# Patient Record
Sex: Female | Born: 1960 | Hispanic: No | Smoking: Never smoker
Health system: Southern US, Community
[De-identification: ages and names within clinical notes are randomized; demographics above are authoritative.]

## PROBLEM LIST (undated history)

## (undated) DIAGNOSIS — R079 Chest pain, unspecified: Secondary | ICD-10-CM

## (undated) DIAGNOSIS — E079 Disorder of thyroid, unspecified: Secondary | ICD-10-CM

## (undated) DIAGNOSIS — I10 Essential (primary) hypertension: Secondary | ICD-10-CM

## (undated) HISTORY — DX: Chest pain, unspecified: R07.9

---

## 2009-05-12 ENCOUNTER — Other Ambulatory Visit: Admission: RE | Admit: 2009-05-12 | Discharge: 2009-05-12 | Payer: Self-pay | Admitting: Family Medicine

## 2011-01-28 ENCOUNTER — Ambulatory Visit: Payer: Self-pay

## 2011-02-18 ENCOUNTER — Ambulatory Visit (INDEPENDENT_AMBULATORY_CARE_PROVIDER_SITE_OTHER): Payer: Self-pay

## 2011-02-18 DIAGNOSIS — I781 Nevus, non-neoplastic: Secondary | ICD-10-CM

## 2012-04-06 ENCOUNTER — Other Ambulatory Visit (HOSPITAL_COMMUNITY)
Admission: RE | Admit: 2012-04-06 | Discharge: 2012-04-06 | Disposition: A | Discharge status: Home / Self Care | Payer: Self-pay | Source: Ambulatory Visit | Attending: Family Medicine | Admitting: Family Medicine

## 2012-04-06 DIAGNOSIS — Z0000 Encounter for general adult medical examination without abnormal findings: Secondary | ICD-10-CM | POA: Insufficient documentation

## 2013-04-23 ENCOUNTER — Encounter (HOSPITAL_COMMUNITY): Payer: Self-pay | Admitting: Emergency Medicine

## 2013-04-23 ENCOUNTER — Emergency Department (HOSPITAL_COMMUNITY): Payer: Self-pay

## 2013-04-23 ENCOUNTER — Emergency Department (HOSPITAL_COMMUNITY)
Admission: EM | Admit: 2013-04-23 | Discharge: 2013-04-23 | Disposition: A | Discharge status: Home / Self Care | Payer: Self-pay | Attending: Emergency Medicine | Admitting: Emergency Medicine

## 2013-04-23 DIAGNOSIS — I10 Essential (primary) hypertension: Secondary | ICD-10-CM | POA: Insufficient documentation

## 2013-04-23 DIAGNOSIS — R252 Cramp and spasm: Secondary | ICD-10-CM | POA: Insufficient documentation

## 2013-04-23 HISTORY — DX: Essential (primary) hypertension: I10

## 2013-04-23 MED ORDER — KETOROLAC TROMETHAMINE 60 MG/2ML IM SOLN
60.0000 mg | Freq: Once | INTRAMUSCULAR | Status: AC
  Administered 2013-04-23: 60 mg via INTRAMUSCULAR
  Filled 2013-04-23: qty 2

## 2013-04-23 MED ORDER — HYDROCODONE-ACETAMINOPHEN 5-325 MG PO TABS: 1.0000 | ORAL_TABLET | Freq: Four times a day (QID) | ORAL | Status: DC | PRN

## 2013-04-23 MED ORDER — IBUPROFEN 600 MG PO TABS: 600.0000 mg | ORAL_TABLET | Freq: Four times a day (QID) | ORAL | Status: DC | PRN

## 2013-04-23 MED ORDER — DIAZEPAM 5 MG PO TABS
5.0000 mg | ORAL_TABLET | Freq: Once | ORAL | Status: AC
  Administered 2013-04-23: 5 mg via ORAL
  Filled 2013-04-23: qty 1

## 2013-04-23 MED ORDER — METHOCARBAMOL 500 MG PO TABS: 500.0000 mg | ORAL_TABLET | Freq: Two times a day (BID) | ORAL | Status: DC

## 2013-04-23 NOTE — ED Provider Notes (Signed)
History     CSN: [id number]  Arrival date & time 04/23/13  1233   First MD Initiated Contact with Patient 04/23/13 1242      Chief Complaint  Patient presents with  . Neck Pain    (Consider location/radiation/quality/duration/timing/severity/associated sxs/prior treatment) HPI Comments: Patient presents emergency department with chief complaint of neck pain x5 months. She states that she was originally working out, and thinks that she strained her neck. She has had worsening pain over the past several days. This morning when she awoke, she states that it was severe pain to the left side of her neck. She denies fevers, chills, nausea, or vomiting. She has tried Tylenol, ibuprofen, and massage therapy. The pain does not radiate. She denies any bowel or bladder incontinence. She is able to ambulate.  She thinks that she may have aggravating her neck muscles by shoveling a load of mulch over the weekend.  The history is provided by the patient. No language interpreter was used.    Past Medical History  Diagnosis Date  . Hypertension     No past surgical history on file.  No family history on file.  History  Substance Use Topics  . Smoking status: Never Smoker   . Smokeless tobacco: Not on file  . Alcohol Use: Yes    OB History   Grav Para Term Preterm Abortions TAB SAB Ect Mult Living                  Review of Systems  All other systems reviewed and are negative.    Allergies  Review of patient's allergies indicates no known allergies.  Home Medications  No current outpatient prescriptions on file.  BP 144/95  Pulse 101  Temp(Src) 98.2 F (36.8 C) (Oral)  Resp 24  SpO2 98%  Physical Exam  Nursing note and vitals reviewed. Constitutional: She is oriented to person, place, and time. She appears well-developed and well-nourished.  Very dramatic at the bedside, tearful, screaming out in pain, but consolable when taking history  HENT:  Head: Normocephalic and  atraumatic.  Eyes: Conjunctivae and EOM are normal.  Neck: Normal range of motion.  Left-sided cervical paraspinal muscles tender to palpation, range of motion and strength deferred secondary to pain  Cardiovascular: Normal rate.   Pulmonary/Chest: Effort normal.  Abdominal: She exhibits no distension.  Musculoskeletal: Normal range of motion.  No C-spine tenderness, no step-offs or deformities, left-sided trapezius tender to palpation  Neurological: She is alert and oriented to person, place, and time.  Skin: Skin is dry.  Psychiatric: She has a normal mood and affect. Her behavior is normal. Judgment and thought content normal.    ED Course  Procedures (including critical care time)  Labs Reviewed - No data to display No results found.   1. Muscle cramps       MDM  Patient with back pain.  No neurological deficits and normal neuro exam.  Patient can walk but states is painful.  No loss of bowel or bladder control.  No concern for cauda equina.  No fever, night sweats, weight loss, h/o cancer, IVDU.  RICE protocol and pain medicine indicated and discussed with patient.   3:32 PM Patient is feeling much better.  No longer crying or wailing in pain.  She is comfortable.  I feel that she is able to be discharged to home with ortho/PCP follow up.  Return precautions given.           Roxy Horseman, PA-C  04/23/13 1543 

## 2013-04-23 NOTE — ED Notes (Signed)
C/O LEFT NECK PAIN X 5 MONTHS. WORSE TODAY. STATES IT STARTED WHEN EXERCISING. HAS BEEN GOING TO A MASSAGE THERAPIST. DENIES NUMBNESS OR TINGLING.

## 2013-04-23 NOTE — ED Notes (Signed)
Patient remains in radiology at this time.

## 2013-04-23 NOTE — ED Notes (Signed)
Patient family member advised that he is going to just take patient somewhere else.   Advised family member I understood.   He said "I can't just let her lay there hurting".   He was advised that we understand that patient is hurting, but that there were others in front of them.  Advised that PA would be in to see as soon as he could.  Family member advised that he understood.

## 2013-04-23 NOTE — ED Notes (Signed)
Dahlia Client, PA at bedside to evaluate.

## 2013-04-23 NOTE — ED Notes (Signed)
Left neck pain x 5 months has been seeing a massage thx but it is not helping has not seen a dr and she had to stop exersizing. Pt states that this am she woke up and it hurt worse

## 2013-04-23 NOTE — ED Notes (Signed)
Patient states "my neck still grabs me if I try to move it, but has relaxed when I am sitting still.

## 2013-04-24 NOTE — ED Provider Notes (Signed)
Medical screening examination/treatment/procedure(s) were performed by non-physician practitioner and as supervising physician I was immediately available for consultation/collaboration.  Raeford Razor, MD 04/24/13 1021

## 2015-09-15 ENCOUNTER — Other Ambulatory Visit: Payer: Self-pay | Admitting: *Deleted

## 2015-09-15 ENCOUNTER — Ambulatory Visit (INDEPENDENT_AMBULATORY_CARE_PROVIDER_SITE_OTHER): Payer: BLUE CROSS/BLUE SHIELD | Admitting: Family Medicine

## 2015-09-15 ENCOUNTER — Other Ambulatory Visit (INDEPENDENT_AMBULATORY_CARE_PROVIDER_SITE_OTHER): Payer: BLUE CROSS/BLUE SHIELD

## 2015-09-15 ENCOUNTER — Encounter: Payer: Self-pay | Admitting: Family Medicine

## 2015-09-15 VITALS — BP 122/84 | HR 68 | Ht 64.0 in | Wt 154.0 lb

## 2015-09-15 DIAGNOSIS — M999 Biomechanical lesion, unspecified: Secondary | ICD-10-CM

## 2015-09-15 DIAGNOSIS — M9908 Segmental and somatic dysfunction of rib cage: Secondary | ICD-10-CM | POA: Diagnosis not present

## 2015-09-15 DIAGNOSIS — M25511 Pain in right shoulder: Secondary | ICD-10-CM

## 2015-09-15 DIAGNOSIS — M9902 Segmental and somatic dysfunction of thoracic region: Secondary | ICD-10-CM

## 2015-09-15 DIAGNOSIS — M9901 Segmental and somatic dysfunction of cervical region: Secondary | ICD-10-CM | POA: Diagnosis not present

## 2015-09-15 DIAGNOSIS — M5030 Other cervical disc degeneration, unspecified cervical region: Secondary | ICD-10-CM | POA: Insufficient documentation

## 2015-09-15 HISTORY — DX: Other cervical disc degeneration, unspecified cervical region: M50.30

## 2015-09-15 HISTORY — DX: Biomechanical lesion, unspecified: M99.9

## 2015-09-15 MED ORDER — DICLOFENAC SODIUM 2 % TD SOLN
TRANSDERMAL | Status: AC
Start: 2015-09-15 — End: ?

## 2015-09-15 NOTE — Progress Notes (Signed)
Pre visit review using our clinic review tool, if applicable. No additional management support is needed unless otherwise documented below in the visit note. 

## 2015-09-15 NOTE — Assessment & Plan Note (Signed)
I do believe that some of the underlying arthritis is likely contribute in. We discussed icing regimen, home exercises, and we discussed proper ergonomics at work. Patient will line. We discussed icing regimen. Patient given oral anti-inflammatory's and then will switch to a topical anti-inflammatory. We discussed over-the-counter natural supplementations and may beneficial. Patient did respond fairly well to osteopathic manipulation. Patient will come back and see me again in 3-4 weeks for further evaluation and treatment.

## 2015-09-15 NOTE — Assessment & Plan Note (Signed)
Decision today to treat with OMT was based on Physical Exam  After verbal consent patient was treated with HVLA, ME, FPR techniques in cervical, thoracic and rib areas  Patient tolerated the procedure well with improvement in symptoms  Patient given exercises, stretches and lifestyle modifications  See medications in patient instructions if given  Patient will follow up in 3-4 weeks      

## 2015-09-15 NOTE — Patient Instructions (Addendum)
Good to see you.  Ice 20 minutes 2 times daily. Usually after activity and before bed. Exercises 3 times a week.  pennsaid pinkie amount topically 2 times daily as needed.  Vitamin D 2000 IU daily Turmeric  twice daily On wall with heels, butt shoulder and head touching for a goal of 5 minutes day Duexis 3 times a day for 3 days.  With work get monitor at eye level and duct tape tennis ball to chair between shoulder blade.  See me again in 3 weeks and if not better we will injection the shoulder

## 2015-09-15 NOTE — Telephone Encounter (Signed)
Refill done.  

## 2015-09-15 NOTE — Progress Notes (Signed)
[facility] [address] [place], [place] [address] Phone: [phone] Subjective:    I'm seeing this patient by the request  of:  No primary care provider on file.   CC: rightshoulder pain.  [id number] Ebony Valdez is a 54 y.o. female coming in with complaint of patient is having shoulder pain. Left-sided. Patient has had a for quite some time.patient has had many different medications over the course of time. Has had workup including x-rays of her neck back in 2014. These were reviewed by me and showed degenerative disc disease at C5-C6. Patient statesshe notices the pain more when sitting a long amount of time. Patient states that it seems to be more on the posterior shoulder blade. Some mild radiation down the arm. Denies weakness. Patient states that it can radiate up towards her neck as well. 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 Patient has tried muscle relaxer, anti-inflammatory, and heat. Patient states that it is more of a constant pain now. Dull throbbing in nature. Denies that it stops her from activities but makes things much more difficult. Not working out on a regular basis that she is to previously. Rates the severity of pain a 7 out of 10.  Previous x-rays in 2014 showed patient does have some mild degenerative disc disease at C5-C6. Sutures reviewed by me today.  Past Medical History  Diagnosis Date  . Hypertension    No past surgical history on file. Social History  Substance Use Topics  . Smoking status: Never Smoker   . Smokeless tobacco: Not on file  . Alcohol Use: Yes   No Known Allergies No family history on file.     Past medical history, social, surgical and family history all reviewed in electronic medical record.   Review of Systems: No headache, visual changes, nausea, vomiting, diarrhea, constipation, dizziness, abdominal pain, skin rash, fevers, chills, night sweats,  weight loss, swollen lymph nodes, body aches, joint swelling, muscle aches, chest pain, shortness of breath, mood changes.   Objective Blood pressure 122/84, pulse 68, height  (1.626 m), weight 154 lb (69.854 kg), SpO2 97 %.  General: No apparent distress alert and oriented x3 mood and affect normal, dressed appropriately.  HEENT: Pupils equal, extraocular movements intact  Respiratory: Patient's speak in full sentences and does not appear short of breath  Cardiovascular: No lower extremity edema, non tender, no erythema  Skin: Warm dry intact with no signs of infection or rash on extremities or on axial skeleton.  Abdomen: Soft nontender  Neuro: Cranial nerves II through XII are intact, neurovascularly intact in all extremities with 2+ DTRs and 2+ pulses.  Lymph: No lymphadenopathy of posterior or anterior cervical chain or axillae bilaterally.  Gait normal with good balance and coordination.  MSK:  Non tender with full range of motion and good stability and symmetric strength and tone of  elbows, wrist, hip, knee and ankles bilaterally.  Neck: Inspection unremarkable. No palpable stepoffs. Negative Spurling's maneuver. Full neck range of motion Grip strength and sensation normal in bilateral hands Strength good C4 to T1 distribution No sensory change to C4 to T1 Negative Hoffman sign bilaterally Reflexes normal Shoulder: right Inspection reveals no abnormalities, atrophy or asymmetry. Palpation is normal with no tenderness over AC joint or bicipital groove. ROM is full in all planes passively. Rotator cuff strength normal throughout. signs of impingement with positive Neer and Hawkin's tests, but negative empty can  sign. Speeds and Yergason's tests normal. No labral pathology noted with negative Obrien's, negative clunk and good stability. Normal scapular function observed. No painful arc and no drop arm sign. No apprehension sign   Osteopathic findings  C4 flexed  rotated and side bent right  C7 flexed rotated and side bent left T1 extended rotated and side bent right with elevated first rib T5 extended rotated and side bent right  Procedure note 97110; 15 minutes spent for Therapeutic exercises as stated in above notes.  This included exercises focusing on stretching, strengthening, with significant focus on eccentric aspects.Basic scapular stabilization to include adduction and depression of scapula Scaption, focusing on proper movement and good control Internal and External rotation utilizing a theraband, with elbow tucked at side entire time Rows with theraband   Proper technique shown and discussed handout in great detail with ATC.  All questions were discussed and answered.      Impression and Recommendations:     This case required medical decision making of moderate complexity.

## 2015-10-06 ENCOUNTER — Ambulatory Visit (INDEPENDENT_AMBULATORY_CARE_PROVIDER_SITE_OTHER): Payer: BLUE CROSS/BLUE SHIELD | Admitting: Family Medicine

## 2015-10-06 ENCOUNTER — Encounter: Payer: Self-pay | Admitting: Family Medicine

## 2015-10-06 VITALS — BP 130/84 | HR 68 | Ht 64.0 in | Wt 157.0 lb

## 2015-10-06 DIAGNOSIS — M999 Biomechanical lesion, unspecified: Secondary | ICD-10-CM

## 2015-10-06 DIAGNOSIS — M5030 Other cervical disc degeneration, unspecified cervical region: Secondary | ICD-10-CM

## 2015-10-06 DIAGNOSIS — M9901 Segmental and somatic dysfunction of cervical region: Secondary | ICD-10-CM

## 2015-10-06 NOTE — Assessment & Plan Note (Signed)
Decision today to treat with OMT was based on Physical Exam  After verbal consent patient was treated with HVLA, ME, FPR techniques in cervical, thoracic and rib areas  Patient tolerated the procedure well with improvement in symptoms  Patient given exercises, stretches and lifestyle modifications  See medications in patient instructions if given  Patient will follow up in 6-8 weeks                  

## 2015-10-06 NOTE — Progress Notes (Signed)
Pre visit review using our clinic review tool, if applicable. No additional management support is needed unless otherwise documented below in the visit note. 

## 2015-10-06 NOTE — Progress Notes (Signed)
  Ebony Valdez D.O. [facility] [address] [place], [place] [address] Phone: [phone] Subjective:      CC: right shoulder pain follow up  GEX:[id number]:Subjective Ebony Valdez is a 54 y.o. female coming in with complaint of patient is having shoulder pain. Left-sided. Patient has had a for quite some time.patient has had many different medications over the course of time. Has had workup including x-rays of her neck back in 2014. These were reviewed by me and showed degenerative disc disease at C5-C6. Patient was seen by me and did have more of a scapular dyskinesia as well as poor posture in her underlying degenerative disc disease seem to complain more of a role ventrally a shoulder injury. Patient did respond very well to osteopathic manipulation. Given home exercises and discussed over-the-counter natural supplementation. Patient statesshe's been doing significantly better. Up until today she was really having no pain. Patient has been working on the ergonomics at work. Denies any new symptoms. Patient has been very happy with the results with no side effects to any of the medications.  Previous x-rays in 2014 showed patient does have some mild degenerative disc disease at C5-C6. Sutures reviewed by me today.  Past Medical History  Diagnosis Date  . Hypertension    No past surgical history on file. Social History  Substance Use Topics  . Smoking status: Never Smoker   . Smokeless tobacco: None  . Alcohol Use: Yes   No Known Allergies No family history on file.     Past medical history, social, surgical and family history all reviewed in electronic medical record.   Review of Systems: No headache, visual changes, nausea, vomiting, diarrhea, constipation, dizziness, abdominal pain, skin rash, fevers, chills, night sweats, weight loss, swollen lymph nodes, body aches, joint swelling, muscle aches, chest pain, shortness of breath, mood changes.   Objective Blood pressure  130/84, pulse 68, height 5\' 4"  (1.626 m), weight 157 lb (71.215 kg), SpO2 98 %.  General: No apparent distress alert and oriented x3 mood and affect normal, dressed appropriately.  HEENT: Pupils equal, extraocular movements intact  Respiratory: Patient's speak in full sentences and does not appear short of breath  Cardiovascular: No lower extremity edema, non tender, no erythema  Skin: Warm dry intact with no signs of infection or rash on extremities or on axial skeleton.  Abdomen: Soft nontender  Neuro: Cranial nerves II through XII are intact, neurovascularly intact in all extremities with 2+ DTRs and 2+ pulses.  Lymph: No lymphadenopathy of posterior or anterior cervical chain or axillae bilaterally.  Gait normal with good balance and coordination.  MSK:  Non tender with full range of motion and good stability and symmetric strength and tone of  elbows, wrist, hip, knee and ankles bilaterally.  Neck: Inspection unremarkable. No palpable stepoffs. Negative Spurling's maneuver. Full neck range of motion Grip strength and sensation normal in bilateral hands Strength good C4 to T1 distribution No sensory change to C4 to T1 Negative Hoffman sign bilaterally Reflexes normal    Osteopathic findings  C4 flexed rotated and side bent right  C7 flexed rotated and side bent left T1 extended rotated and side bent right with elevated first rib T5 extended rotated and side bent right L2 flexed rotated and side bent right     Impression and Recommendations:     This case required medical decision making of moderate complexity.

## 2015-10-06 NOTE — Patient Instructions (Signed)
You are doing amazing No changes whatsoever Continue with the posture Excises still 3 times a week for 6 weeks.  When getting back in the gym keep hands within peripheral vision Also start at 50% of weight you were doing but then increase 25% a week.  See me again 6 weeks.

## 2015-10-06 NOTE — Assessment & Plan Note (Signed)
Patient does have degenerative disc disease but it is very mild overall. I do think that this is likely secondary to more poor posture. Continues to respond very well to osteopathic manipulation. Warned of any type of flare to take ibuprofen. Patient will continue all other medications and will follow-up with me again in 6 weeks for further evaluation and treatment.

## 2016-03-01 ENCOUNTER — Ambulatory Visit: Payer: BLUE CROSS/BLUE SHIELD | Admitting: Family Medicine

## 2017-09-10 ENCOUNTER — Ambulatory Visit (HOSPITAL_COMMUNITY)
Admission: EM | Admit: 2017-09-10 | Discharge: 2017-09-10 | Disposition: A | Discharge status: Home / Self Care | Payer: BLUE CROSS/BLUE SHIELD | Attending: Family Medicine | Admitting: Family Medicine

## 2017-09-10 ENCOUNTER — Encounter (HOSPITAL_COMMUNITY): Payer: Self-pay | Admitting: *Deleted

## 2017-09-10 ENCOUNTER — Ambulatory Visit (INDEPENDENT_AMBULATORY_CARE_PROVIDER_SITE_OTHER): Payer: Self-pay

## 2017-09-10 DIAGNOSIS — M25532 Pain in left wrist: Secondary | ICD-10-CM

## 2017-09-10 HISTORY — DX: Disorder of thyroid, unspecified: E07.9

## 2017-09-10 MED ORDER — PREDNISONE 10 MG (21) PO TBPK
ORAL_TABLET | Freq: Every day | ORAL | 0 refills | Status: DC
Start: 2017-09-10 — End: 2020-12-28

## 2017-09-10 NOTE — ED Triage Notes (Signed)
C/O left wrist pain x "a couple months" without known injury.  Has tried wrist brace.  Yesterday while mowing lawn, felt like left wrist popped - states "had to pop it back into place".  Continues with significant pain.

## 2017-09-10 NOTE — ED Provider Notes (Signed)
  [facility]   [id number] 09/10/17 Arrival Time: 1248  ASSESSMENT & PLAN:  1. Left wrist pain     Meds ordered this encounter  Medications  . predniSONE (STERAPRED UNI-PAK 21 TAB) 10 MG (21) TBPK tablet    Sig: Take by mouth daily. Take as directed.    Dispense:  21 tablet    Refill:  0   Thumb spica splint applied. Will f/u with PCP if not improving within one week.  Reviewed expectations re: course of current medical issues. Questions answered. Outlined signs and symptoms indicating need for more acute intervention. Patient verbalized understanding. After Visit Summary given.   SUBJECTIVE:  Ebony Valdez is a 56 y.o. female who reports pain of her left wrist. Gradual onset approx 2 months ago. No injury or trauma. Does reports that "it pops out and I have to turn my wrist to get it back into place." This last happened yesterday while mowing lawn. OTC wrist splint without relief. No specific aggravating or alleviating factors reported. Occasional ibuprofen with mild help. No distal numbness/tingling. 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 Questions relation. R-handed.  ROS: As per HPI.   OBJECTIVE:  Vitals:   09/10/17 1331  BP: 130/81  Pulse: 75  Resp: 16  Temp: 97.9 F (36.6 C)  TempSrc: Oral  SpO2: 99%    General appearance: alert; no distress Extremities: no cyanosis or edema; symmetrical with no gross deformities; tenderness over her left lateral wrist with no swelling and no bruising; FROM; normal grip CV: normal extremity capillary refill Skin: warm and dry Neurologic: normal gait; normal symmetric reflexes in all extremities; normal sensation  Psychological: alert and cooperative; normal mood and affect  Imaging: Dg Wrist Complete Left  Result Date: 09/10/2017 CLINICAL DATA:  Left wrist pain for 2 months.  Worsening pain. EXAM: LEFT WRIST - COMPLETE 3+ VIEW COMPARISON:  None. FINDINGS: Left wrist is located without a fracture or dislocation.  Alignment is within normal limits. Soft tissues are unremarkable. Carpal bones are intact. IMPRESSION: Negative radiographs of the left wrist. Electronically Signed   By: Richarda Overlie M.D.   On: 09/10/2017 14:00    No Known Allergies  Past Medical History:  Diagnosis Date  . Hypertension    situational  . Thyroid disease    Social History   Social History  . Marital status: Unknown    Spouse name: N/A  . Number of children: N/A  . Years of education: N/A   Occupational History  . Not on file.   Social History Main Topics  . Smoking status: Never Smoker  . Smokeless tobacco: Never Used  . Alcohol use Yes     Comment: occasionally  . Drug use: No  . Sexual activity: Not on file   Other Topics Concern  . Not on file   Social History Narrative  . No narrative on file      Mardella Layman, MD 09/10/17 1420

## 2020-02-11 ENCOUNTER — Encounter (HOSPITAL_COMMUNITY): Payer: Self-pay | Admitting: Emergency Medicine

## 2020-02-11 ENCOUNTER — Emergency Department (HOSPITAL_COMMUNITY)
Admission: EM | Admit: 2020-02-11 | Discharge: 2020-02-12 | Disposition: A | Discharge status: Home / Self Care | Payer: 59 | Attending: Emergency Medicine | Admitting: Emergency Medicine

## 2020-02-11 ENCOUNTER — Other Ambulatory Visit: Payer: Self-pay

## 2020-02-11 ENCOUNTER — Emergency Department (HOSPITAL_COMMUNITY): Payer: 59

## 2020-02-11 DIAGNOSIS — R0789 Other chest pain: Secondary | ICD-10-CM

## 2020-02-11 DIAGNOSIS — R9431 Abnormal electrocardiogram [ECG] [EKG]: Secondary | ICD-10-CM | POA: Diagnosis not present

## 2020-02-11 LAB — BASIC METABOLIC PANEL
Anion gap: 11 (ref 5–15)
BUN: 9 mg/dL (ref 6–20)
CO2: 23 mmol/L (ref 22–32)
Calcium: 9.1 mg/dL (ref 8.9–10.3)
Chloride: 102 mmol/L (ref 98–111)
Creatinine, Ser: 0.99 mg/dL (ref 0.44–1.00)
GFR calc Af Amer: >60 mL/min (ref >60)
GFR calc non Af Amer: >60 mL/min (ref >60)
Glucose, Bld: 126 mg/dL — ABNORMAL HIGH (ref 70–99)
Potassium: 3.2 mmol/L — ABNORMAL LOW (ref 3.5–5.1)
Sodium: 136 mmol/L (ref 135–145)

## 2020-02-11 LAB — CBC
HCT: 34.2 % — ABNORMAL LOW (ref 36.0–46.0)
Hemoglobin: 11.4 g/dL — ABNORMAL LOW (ref 12.0–15.0)
MCH: 30 pg (ref 26.0–34.0)
MCHC: 33.3 g/dL (ref 30.0–36.0)
MCV: 90 fL (ref 80.0–100.0)
Platelets: 182 10*3/uL (ref 150–400)
RBC: 3.8 MIL/uL — ABNORMAL LOW (ref 3.87–5.11)
RDW: 12.3 % (ref 11.5–15.5)
WBC: 4.3 10*3/uL (ref 4.0–10.5)
nRBC: 0 % (ref 0.0–0.2)

## 2020-02-11 LAB — I-STAT BETA HCG BLOOD, ED (MC, WL, AP ONLY): I-stat hCG, quantitative: <5 m[IU]/mL (ref <5)

## 2020-02-11 LAB — TROPONIN I (HIGH SENSITIVITY)
Troponin I (High Sensitivity): <2 ng/L (ref <18)
Troponin I (High Sensitivity): 4 ng/L (ref <18)

## 2020-02-11 MED ORDER — SODIUM CHLORIDE 0.9% FLUSH: 3.0000 mL | Freq: Once | INTRAVENOUS | Status: DC

## 2020-02-11 NOTE — ED Notes (Signed)
[phone] Pts daughter Christoper Allegra, call and update when avail. Aware pt is in waiting

## 2020-02-11 NOTE — ED Triage Notes (Signed)
Per EMS, pt from home, began having substernal sharp/stabbing 10/10 chest pain that radiates to her left arm.  Took 324 ASA prior to EMS, given one nitro by EMS, no change in pain (BP from 170/90 to 139/92).    18 G L arm 90 HR 98% RA 98.1 temp

## 2020-02-12 NOTE — Discharge Instructions (Signed)
I recommend close follow-up with your primary care physician and cardiology as an outpatient.  Your cardiac labs, chest x-ray today were normal.  Your EKG did show inferior lateral T wave inversions but no old for comparison.  Your case was discussed with the cardiologist on-call who felt it was reasonable to like to be discharged home with close outpatient follow-up given you are asymptomatic and have had 2 negative high-sensitivity troponins.  If you have any return of chest pain, shortness of breath, sudden sweating, feel like you are going to pass out or do pass out, please return to the emergency department immediately.

## 2020-02-12 NOTE — ED Notes (Signed)
Pt to ED RM 22 from [facility]. Pt is A&Ox4, in NAD. Breathing easy, non-labored. equal rise and fall of chest noted. Pt reports she called EMS after having substernal sharp/stabbing 10/10 chest pain that radiates to her left arm.  Took 324 ASA prior to EMS, given one nitro by EMS. Pt denies CP presently. Denies SOB. Denies N/V/D

## 2020-02-12 NOTE — ED Provider Notes (Signed)
TIME SEEN: 12:47 AM  CHIEF COMPLAINT: Chest pain  HPI: Patient is a 59 year old female with history of hypothyroidism who presents to the emergency department with sharp central chest pain that started yesterday.  States it took her breath away.  She would have shortness of breath with exertion but no exertional chest pain.  States she thought initially this was acid reflux because it felt similar.  Pain now completely resolved.  Did have some mild dizziness.  No nausea, vomiting, diaphoresis.  No history of diabetes, hyperlipidemia.  She does have situational hypertension but is not on medications.  No history of tobacco use.  No family history of CAD.  No calf tenderness or calf swelling.  ROS: See HPI Constitutional: no fever  Eyes: no drainage  ENT: no runny nose   Cardiovascular:   chest pain  Resp:  SOB  GI: no vomiting GU: no dysuria Integumentary: no rash  Allergy: no hives  Musculoskeletal: no leg swelling  Neurological: no slurred speech ROS otherwise negative  PAST MEDICAL HISTORY/PAST SURGICAL HISTORY:  Past Medical History:  Diagnosis Date  . Hypertension    situational  . Thyroid disease     MEDICATIONS:  Prior to Admission medications   Medication Sig Start Date End Date Taking? Authorizing Provider  Diclofenac Sodium 2 % SOLN Apply 1 pump twice daily. 09/15/15   Judi Saa, DO  diphenhydrAMINE (BENADRYL) 25 MG tablet Take 75 mg by mouth at bedtime as needed for itching.    [provider]  Echinacea 500 MG CAPS Take 1 capsule by mouth daily.    [provider]  HYDROcodone-acetaminophen (NORCO/VICODIN) 5-325 MG per tablet Take 1 tablet by mouth every 6 (six) hours as needed for pain. 04/23/13   Roxy Horseman, PA-C  ibuprofen (ADVIL,MOTRIN) 600 MG tablet Take 1 tablet (600 mg total) by mouth every 6 (six) hours as needed for pain. 04/23/13   Roxy Horseman, PA-C  Levothyroxine Sodium (SYNTHROID PO) Take by mouth.    [provider]  methocarbamol (ROBAXIN) 500 MG tablet Take 1 tablet (500 mg total) by mouth 2 (two) times daily. 04/23/13   Roxy Horseman, PA-C  Multiple Vitamin (MULTIVITAMIN WITH MINERALS) TABS Take 1 tablet by mouth daily.    [provider]  predniSONE (STERAPRED UNI-PAK 21 TAB) 10 MG (21) TBPK tablet Take by mouth daily. Take as directed. 09/10/17   Mardella Layman, MD  pseudoephedrine-guaifenesin (MUCINEX D) 60-600 MG per tablet Take 1 tablet by mouth daily as needed for congestion (for allergies).    [provider]  vitamin B-12 (CYANOCOBALAMIN) 1000 MCG tablet Take 1,000 mcg by mouth daily.    [provider]  vitamin C (ASCORBIC ACID) 500 MG tablet Take 500 mg by mouth daily.    [provider]  vitamin E 400 UNIT capsule Take 400 Units by mouth daily.    [provider]    ALLERGIES:  No Known Allergies  SOCIAL HISTORY:  Social History   Tobacco Use  . Smoking status: Never Smoker  . Smokeless tobacco: Never Used  Substance Use Topics  . Alcohol use: Yes    Comment: occasionally    FAMILY HISTORY: No family history on file.  EXAM: BP 134/81 (BP Location: Right Arm)   Pulse 64   Temp 98.3 F (36.8 C) (Oral)   Resp 14   SpO2 100%  CONSTITUTIONAL: Alert and oriented and responds appropriately to questions. Well-appearing; well-nourished HEAD: Normocephalic EYES: Conjunctivae clear, pupils appear equal, EOM appear  intact ENT: normal nose; moist mucous membranes NECK: Supple, normal ROM CARD: RRR; S1 and S2 appreciated; no murmurs, no clicks, no rubs, no gallops RESP: Normal chest excursion without splinting or tachypnea; breath sounds clear and equal bilaterally; no wheezes, no rhonchi, no rales, no hypoxia or respiratory distress, speaking full sentences ABD/GI: Normal bowel sounds; non-distended; soft, non-tender, no rebound, no guarding, no peritoneal signs, no hepatosplenomegaly BACK:  The back appears normal EXT: Normal ROM in all  joints; no deformity noted, no edema; no cyanosis, no calf tenderness or calf swelling SKIN: Normal color for age and race; warm; no rash on exposed skin NEURO: Moves all extremities equally PSYCH: The patient's mood and manner are appropriate.   MEDICAL DECISION MAKING: Patient here with atypical chest pain.  No risk factors for ACS other than age.  EKG shows inferolateral T wave inversions with no old for comparison.  She has had 2 negative troponins here.  Completely chest pain-free at this time.  Doubt ACS, PE, dissection.  Patient's heart score is 2 based on age and abnormal EKG.  I feel safe discharging patient home with close outpatient cardiology follow-up.  She is also comfortable with this plan.  Discussed strict return precautions.  Patient verbalized understanding.   At this time, I do not feel there is any life-threatening condition present. I have reviewed, interpreted and discussed all results (EKG, imaging, lab, urine as appropriate) and exam findings with patient/family. I have reviewed nursing notes and appropriate previous records.  I feel the patient is safe to be discharged home without further emergent workup and can continue workup as an outpatient as needed. Discussed usual and customary return precautions. Patient/family verbalize understanding and are comfortable with this plan.  Outpatient follow-up has been provided as needed. All questions have been answered.     EKG Interpretation  Date/Time:  Monday February 11 2020 19:22:49 EST Ventricular Rate:  88 PR Interval:  154 QRS Duration: 64 QT Interval:  338 QTC Calculation: 408 R Axis:   23 Text Interpretation: Normal sinus rhythm ST & T wave abnormality, consider inferolateral ischemia Abnormal ECG No old tracing to compare Confirmed by Merrily Pew [phone]) on 02/11/2020 10:18:53 PM         EKG Interpretation  Date/Time:  Monday February 11 2020 20:03:06 EST Ventricular Rate:  77 PR Interval:  156 QRS  Duration: 66 QT Interval:  348 QTC Calculation: 393 R Axis:   22 Text Interpretation: Normal sinus rhythm Septal infarct , age undetermined ST & T wave abnormality, consider inferolateral ischemia Abnormal ECG No significant change since last tracing Confirmed by Pryor Curia [phone]) on 02/12/2020 12:48:16 AM         EKG Interpretation  Date/Time:  Tuesday February 12 2020 01:38:30 EST Ventricular Rate:  57 PR Interval:  156 QRS Duration: 78 QT Interval:  410 QTC Calculation: 400 R Axis:   30 Text Interpretation: Sinus rhythm Borderline repolarization abnormality No significant change since last tracing Confirmed by Pryor Curia [phone]) on 02/12/2020 1:43:06 AM           Junius Finner was evaluated in Emergency Department on 02/12/2020 for the symptoms described in the history of present illness. She was evaluated in the context of the global COVID-19 pandemic, which necessitated consideration that the patient might be at risk for infection with the SARS-CoV-2 virus that causes COVID-19. Institutional protocols and algorithms that pertain to the evaluation of patients at risk for COVID-19 are in a state of rapid change based  on information released by regulatory bodies including the CDC and federal and state organizations. These policies and algorithms were followed during the patient's care in the ED.  Patient was seen wearing N95, face shield, gloves.    Sarita Hakanson, Layla Maw, DO 02/12/20 [phone]

## 2020-02-12 NOTE — ED Notes (Addendum)
Patient verbalizes understanding of discharge instructions and follow up care. Opportunity for questioning and answers were provided. All questions answered completely. PIV removed, catheter intact. Site dressed with gauze and tape. Armband removed by staff, pt discharged from ED. Ambulatory from ED with strong, steady gait 

## 2020-02-14 ENCOUNTER — Ambulatory Visit: Payer: 59 | Admitting: Cardiology

## 2020-02-27 ENCOUNTER — Ambulatory Visit: Payer: 59 | Admitting: Cardiovascular Disease

## 2020-03-18 ENCOUNTER — Ambulatory Visit: Payer: 59 | Admitting: Cardiology

## 2020-04-03 ENCOUNTER — Other Ambulatory Visit (HOSPITAL_COMMUNITY)
Admission: RE | Admit: 2020-04-03 | Discharge: 2020-04-03 | Disposition: A | Discharge status: Home / Self Care | Payer: 59 | Source: Ambulatory Visit | Attending: Family Medicine | Admitting: Family Medicine

## 2020-04-03 DIAGNOSIS — Z124 Encounter for screening for malignant neoplasm of cervix: Secondary | ICD-10-CM | POA: Diagnosis present

## 2020-04-07 ENCOUNTER — Ambulatory Visit: Payer: 59 | Admitting: Cardiology

## 2020-04-10 LAB — CYTOLOGY - PAP
Comment: NEGATIVE
Diagnosis: NEGATIVE
High risk HPV: NEGATIVE

## 2020-05-05 IMAGING — CR DG CHEST 2V
2 series · 2 of 2 positions shown · non-contrast
Comparison: None.

CLINICAL DATA: Nausea.  Chest pain.  Headache for 1 day.

EXAM:
CHEST - 2 VIEW

[chest pa]
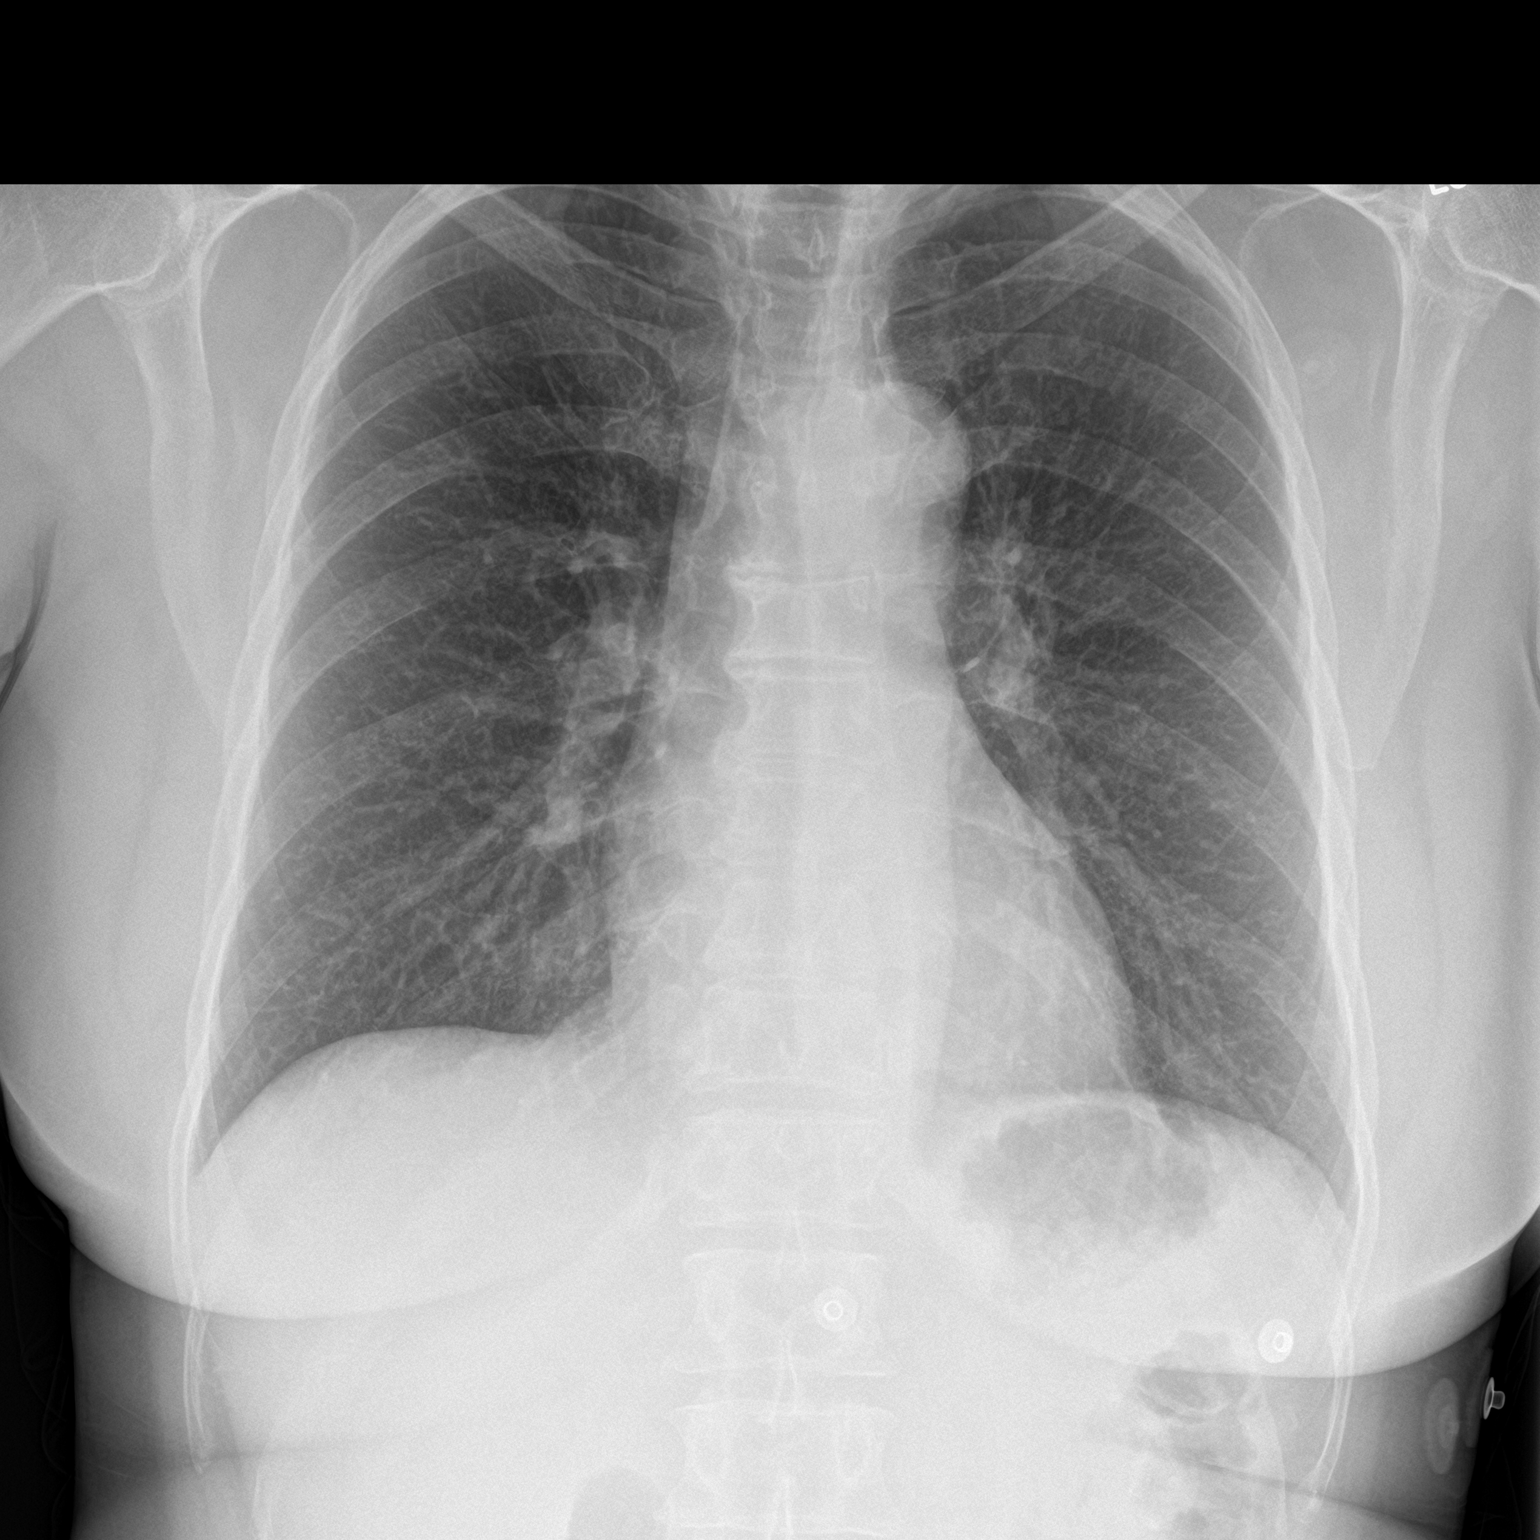

[chest lat]
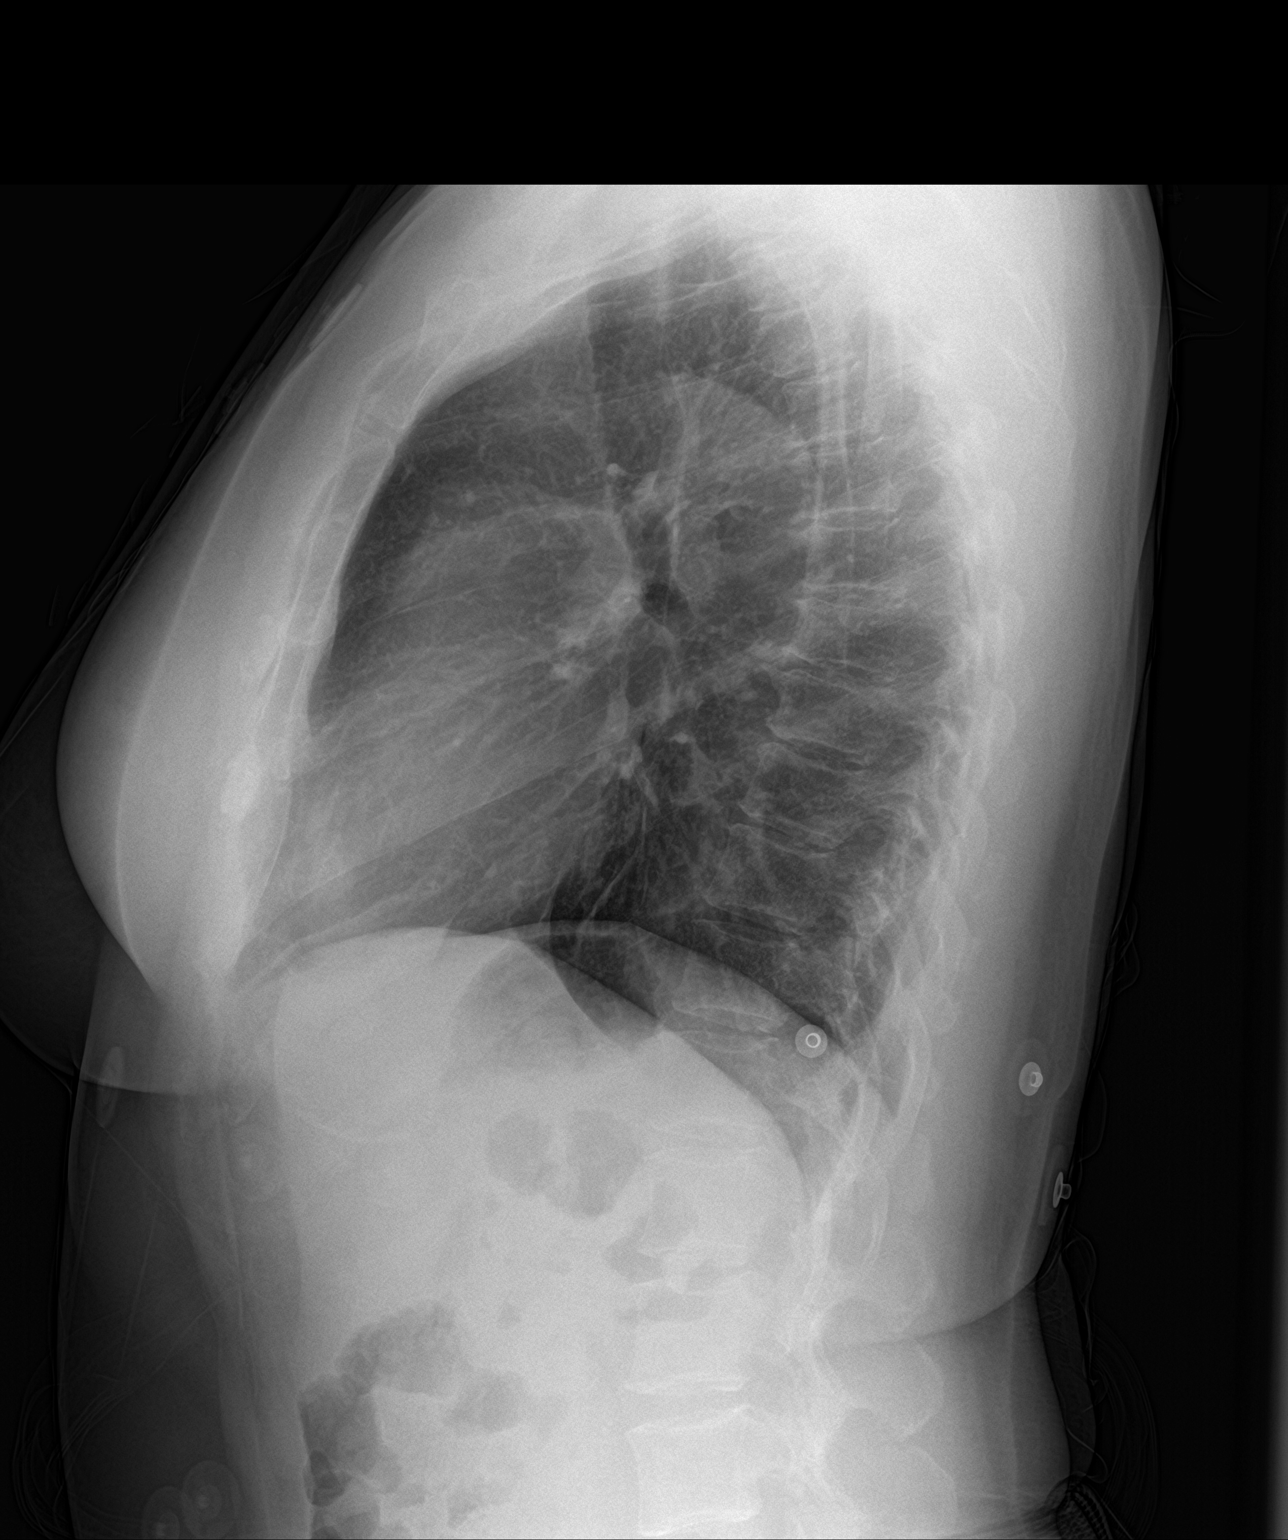

[2 of 2 positions shown; findings below may reference images not displayed]

FINDINGS: Midline trachea.  Normal heart size and mediastinal contours.

Sharp costophrenic angles.  No pneumothorax.  Clear lungs.
IMPRESSION: -:
IMPRESSION: -
Normal chest.

## 2020-12-28 ENCOUNTER — Ambulatory Visit (HOSPITAL_COMMUNITY)
Admission: EM | Admit: 2020-12-28 | Discharge: 2020-12-28 | Disposition: A | Discharge status: Home / Self Care | Payer: 59 | Attending: Emergency Medicine | Admitting: Emergency Medicine

## 2020-12-28 ENCOUNTER — Other Ambulatory Visit: Payer: Self-pay

## 2020-12-28 ENCOUNTER — Ambulatory Visit (INDEPENDENT_AMBULATORY_CARE_PROVIDER_SITE_OTHER): Payer: 59

## 2020-12-28 ENCOUNTER — Encounter (HOSPITAL_COMMUNITY): Payer: Self-pay

## 2020-12-28 DIAGNOSIS — H9202 Otalgia, left ear: Secondary | ICD-10-CM

## 2020-12-28 DIAGNOSIS — R059 Cough, unspecified: Secondary | ICD-10-CM | POA: Diagnosis not present

## 2020-12-28 DIAGNOSIS — J029 Acute pharyngitis, unspecified: Secondary | ICD-10-CM

## 2020-12-28 MED ORDER — FLUTICASONE PROPIONATE 50 MCG/ACT NA SUSP
1.0000 | Freq: Every day | NASAL | 0 refills | Status: AC
Start: 2020-12-28 — End: ?

## 2020-12-28 MED ORDER — BENZONATATE 100 MG PO CAPS: 100.0000 mg | ORAL_CAPSULE | Freq: Three times a day (TID) | ORAL | 0 refills | Status: DC | PRN

## 2020-12-28 MED ORDER — PREDNISONE 20 MG PO TABS: 40.0000 mg | ORAL_TABLET | Freq: Every day | ORAL | 0 refills | Status: AC

## 2020-12-28 NOTE — Discharge Instructions (Signed)
Your chest xray and physical exam are normal today.  I do not see any indication for further antibiotics.  I would like to try some prednisone, steroid, to see if this helps with your cough and ear/throat sensation.  Continue with over the counter medication such as mucinex, as needed.  Tessalon as needed for cough.  I would also like you to start daily flonase.  Please follow up with your primary care provider for recheck in the next two weeks.  Return for any worsening of symptoms.

## 2020-12-28 NOTE — ED Triage Notes (Signed)
Pt presents with productive cough and sore throat for over 2 weeks.

## 2020-12-28 NOTE — ED Provider Notes (Signed)
[facility]    CSN: [id number] Arrival date & time: 12/28/20  1142      History   Chief Complaint Chief Complaint  Patient presents with  . Cough  . Sore Throat    HPI Ebony Valdez is a 60 y.o. female.   Productive cough, no shortness of breath , left ear/ throat pain. Symptoms started around 12/25. A week after this her PCP prescribed her an antibiotic which she took for 5 days, she felt like maybe she mildly improved but symptoms didn't resolve. Now with more left ear/throat pain, as well as productive cough. No shortness of breath . No fevers. No dizziness. Cough is sometimes productive. No gi symptoms. No asthma history.    ROS per HPI, negative if not otherwise mentioned.      Past Medical History:  Diagnosis Date  . Chest pain   . Degenerative disc disease, cervical 09/15/2015   Mild C5-C6   . Hypertension    situational  . Nonallopathic lesion of cervical region 09/15/2015  . Nonallopathic lesion of thoracic region 09/15/2015  . Nonallopathic lesion-rib cage 09/15/2015  . Thyroid disease     Patient Active Problem List   Diagnosis Date Noted  . Degenerative disc disease, cervical 09/15/2015  . Nonallopathic lesion of cervical region 09/15/2015  . Nonallopathic lesion-rib cage 09/15/2015  . Nonallopathic lesion of thoracic region 09/15/2015    History reviewed. No pertinent surgical history.  OB History   No obstetric history on file.      Home Medications    Prior to Admission medications   Medication Sig Start Date End Date Taking? Authorizing Provider  benzonatate (TESSALON) 100 MG capsule Take 1-2 capsules (100-200 mg total) by mouth 3 (three) times daily as needed for cough. 12/28/20  Yes Burky, Dorene Grebe B, NP  fluticasone (FLONASE) 50 MCG/ACT nasal spray Place 1 spray into both nostrils daily. 12/28/20  Yes Burky, Dorene Grebe B, NP  predniSONE (DELTASONE) 20 MG tablet Take 2 tablets (40 mg total) by mouth daily with breakfast for 5  days. 12/28/20 01/02/21 Yes Burky, Dorene Grebe B, NP  Diclofenac Sodium 2 % SOLN Apply 1 pump twice daily. 09/15/15   Judi Saa, DO  diphenhydrAMINE (BENADRYL) 25 MG tablet Take 75 mg by mouth at bedtime as needed for itching.    [provider]  Echinacea 500 MG CAPS Take 1 capsule by mouth daily.    [provider]  HYDROcodone-acetaminophen (NORCO/VICODIN) 5-325 MG per tablet Take 1 tablet by mouth every 6 (six) hours as needed for pain. 04/23/13   Roxy Horseman, PA-C  ibuprofen (ADVIL,MOTRIN) 600 MG tablet Take 1 tablet (600 mg total) by mouth every 6 (six) hours as needed for pain. 04/23/13   Roxy Horseman, PA-C  Levothyroxine Sodium (SYNTHROID PO) Take by mouth.    [provider]  methocarbamol (ROBAXIN) 500 MG tablet Take 1 tablet (500 mg total) by mouth 2 (two) times daily. 04/23/13   Roxy Horseman, PA-C  Multiple Vitamin (MULTIVITAMIN WITH MINERALS) TABS Take 1 tablet by mouth daily.    [provider]  pseudoephedrine-guaifenesin (MUCINEX D) 60-600 MG per tablet Take 1 tablet by mouth daily as needed for congestion (for allergies).    [provider]  vitamin B-12 (CYANOCOBALAMIN) 1000 MCG tablet Take 1,000 mcg by mouth daily.    [provider]  vitamin C (ASCORBIC ACID) 500 MG tablet Take 500 mg by mouth daily.    [provider]  vitamin E 400 UNIT capsule Take  400 Units by mouth daily.    [provider]    Family History History reviewed. No pertinent family history.  Social History Social History   Tobacco Use  . Smoking status: Never Smoker  . Smokeless tobacco: Never Used  Substance Use Topics  . Alcohol use: Yes    Comment: occasionally  . Drug use: No     Allergies   Patient has no known allergies.   Review of Systems Review of Systems   Physical Exam Triage Vital Signs ED Triage Vitals  Enc Vitals Group     BP 12/28/20 1229 126/88     Pulse Rate 12/28/20 1229 72     Resp  12/28/20 1229 17     Temp 12/28/20 1229 98.5 F (36.9 C)     Temp Source 12/28/20 1229 Oral     SpO2 12/28/20 1229 100 %     Weight --      Height --      Head Circumference --      Peak Flow --      Pain Score 12/28/20 1227 6     Pain Loc --      Pain Edu? --      Excl. in GC? --    No data found.  Updated Vital Signs BP 126/88 (BP Location: Right Arm)   Pulse 72   Temp 98.5 F (36.9 C) (Oral)   Resp 17   SpO2 100%   Visual Acuity Right Eye Distance:   Left Eye Distance:   Bilateral Distance:    Right Eye Near:   Left Eye Near:    Bilateral Near:     Physical Exam Constitutional:      General: She is not in acute distress.    Appearance: She is well-developed.  HENT:     Right Ear: Tympanic membrane and ear canal normal.     Left Ear: Tympanic membrane normal.     Mouth/Throat:     Mouth: Mucous membranes are moist. No oral lesions.     Pharynx: No oropharyngeal exudate or uvula swelling.     Tonsils: No tonsillar exudate.  Cardiovascular:     Rate and Rhythm: Normal rate.  Pulmonary:     Effort: Pulmonary effort is normal. No respiratory distress.     Breath sounds: Normal breath sounds.  Skin:    General: Skin is warm and dry.  Neurological:     Mental Status: She is alert and oriented to person, place, and time.      UC Treatments / Results  Labs (all labs ordered are listed, but only abnormal results are displayed) Labs Reviewed - No data to display  EKG   Radiology DG Chest 2 View  Result Date: 12/28/2020 CLINICAL DATA:  Patient with cough for multiple weeks.  Sore throat. EXAM: CHEST - 2 VIEW COMPARISON:  Chest radiograph 02/11/2020 FINDINGS: The heart size and mediastinal contours are within normal limits. Both lungs are clear. The visualized skeletal structures are unremarkable. IMPRESSION: No active cardiopulmonary disease. Electronically Signed   By: Annia Belt M.D.   On: 12/28/2020 13:03    Procedures Procedures (including critical  care time)  Medications Ordered in UC Medications - No data to display  Initial Impression / Assessment and Plan / UC Course  I have reviewed the triage vital signs and the nursing notes.  Pertinent labs & imaging results that were available during my care of the patient were reviewed by me and considered in my  medical decision making (see chart for details).     Non toxic. Benign physical exam. Chest xray without acute findings. Encouraged to continue taking her her nexium for reflux. A few days of prednisone to try to help her throat/ ear symptoms as well as chest symptoms. Follow up with PCP prn. Return precautions provided. Patient verbalized understanding and agreeable to plan.   Final Clinical Impressions(s) / UC Diagnoses   Final diagnoses:  Cough  Otalgia of left ear     Discharge Instructions     Your chest xray and physical exam are normal today.  I do not see any indication for further antibiotics.  I would like to try some prednisone, steroid, to see if this helps with your cough and ear/throat sensation.  Continue with over the counter medication such as mucinex, as needed.  Tessalon as needed for cough.  I would also like you to start daily flonase.  Please follow up with your primary care provider for recheck in the next two weeks.  Return for any worsening of symptoms.     ED Prescriptions    Medication Sig Dispense Auth. Provider   predniSONE (DELTASONE) 20 MG tablet Take 2 tablets (40 mg total) by mouth daily with breakfast for 5 days. 10 tablet Linus Mako B, NP   fluticasone (FLONASE) 50 MCG/ACT nasal spray Place 1 spray into both nostrils daily. 16 g Linus Mako B, NP   benzonatate (TESSALON) 100 MG capsule Take 1-2 capsules (100-200 mg total) by mouth 3 (three) times daily as needed for cough. 21 capsule Georgetta Haber, NP     PDMP not reviewed this encounter.   Georgetta Haber, NP 12/28/20 [phone]

## 2021-03-22 IMAGING — DX DG CHEST 2V
2 series · 2 of 2 positions shown · non-contrast
Comparison: Chest radiograph 02/11/2020

CLINICAL DATA: Patient with cough for multiple weeks.  Sore throat.

EXAM:
CHEST - 2 VIEW

[chest pa]
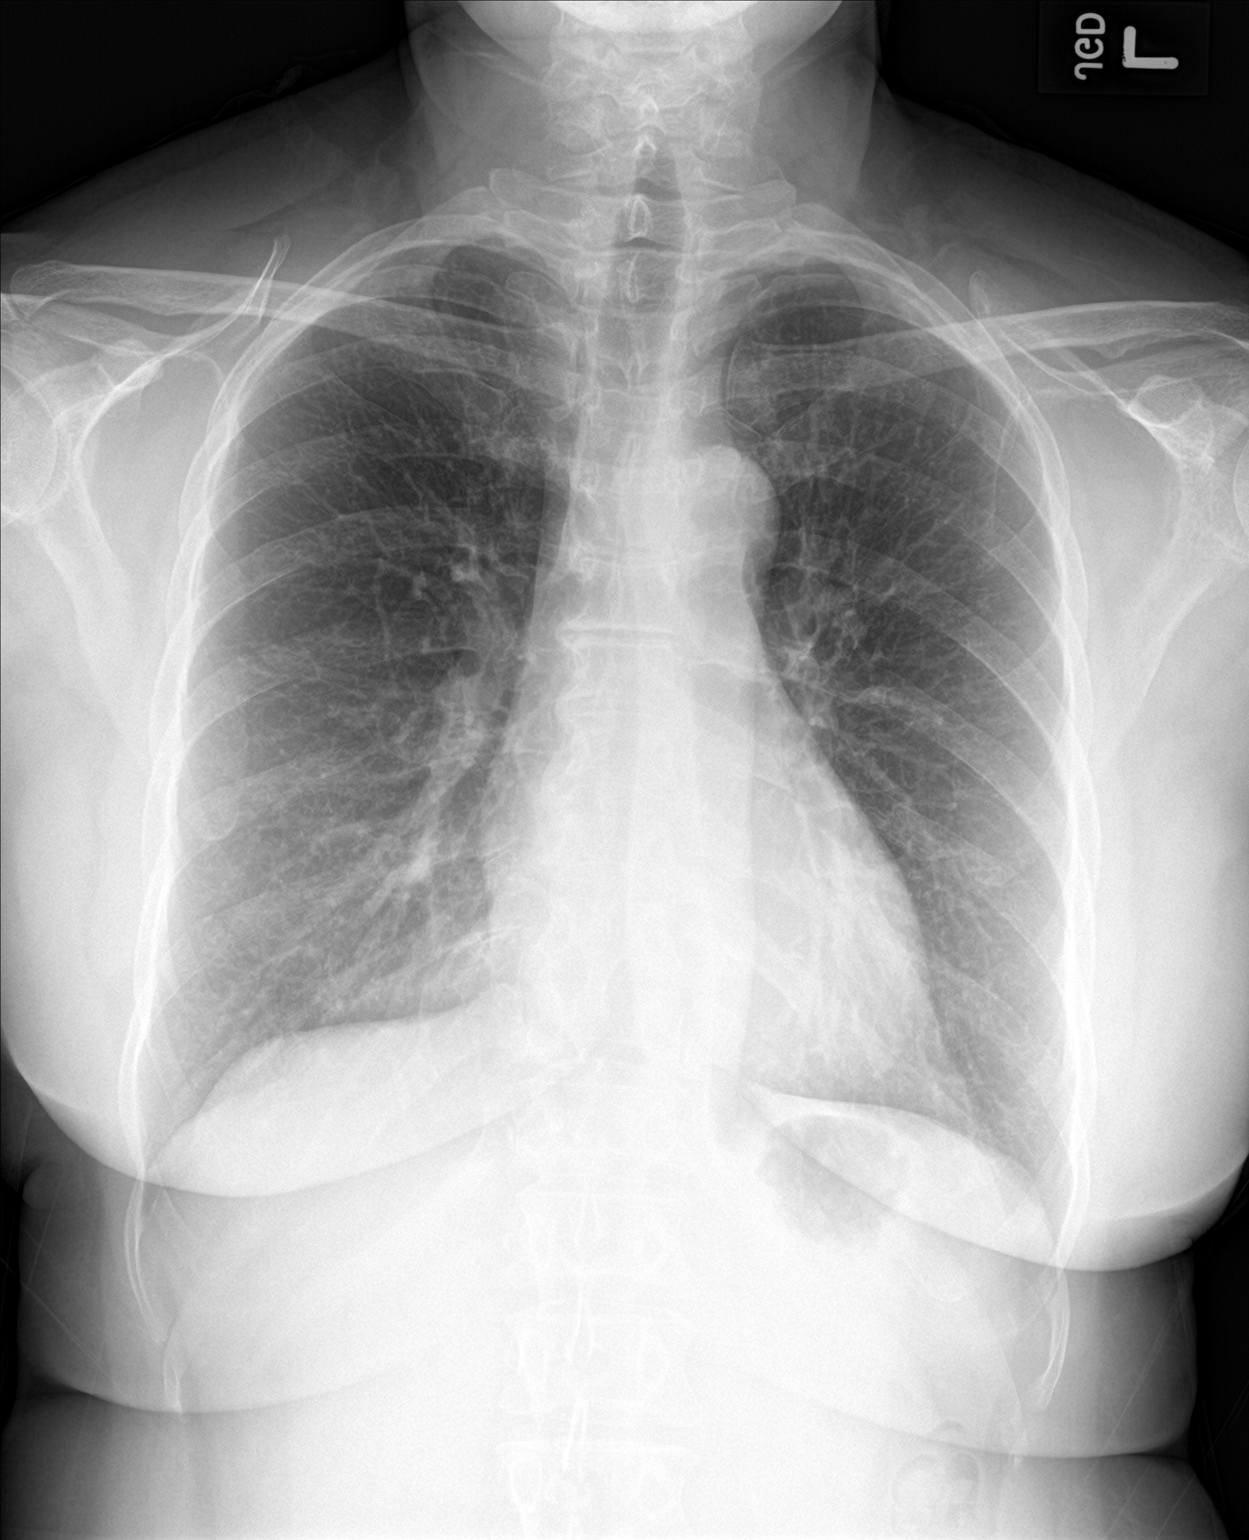

[chest lat]
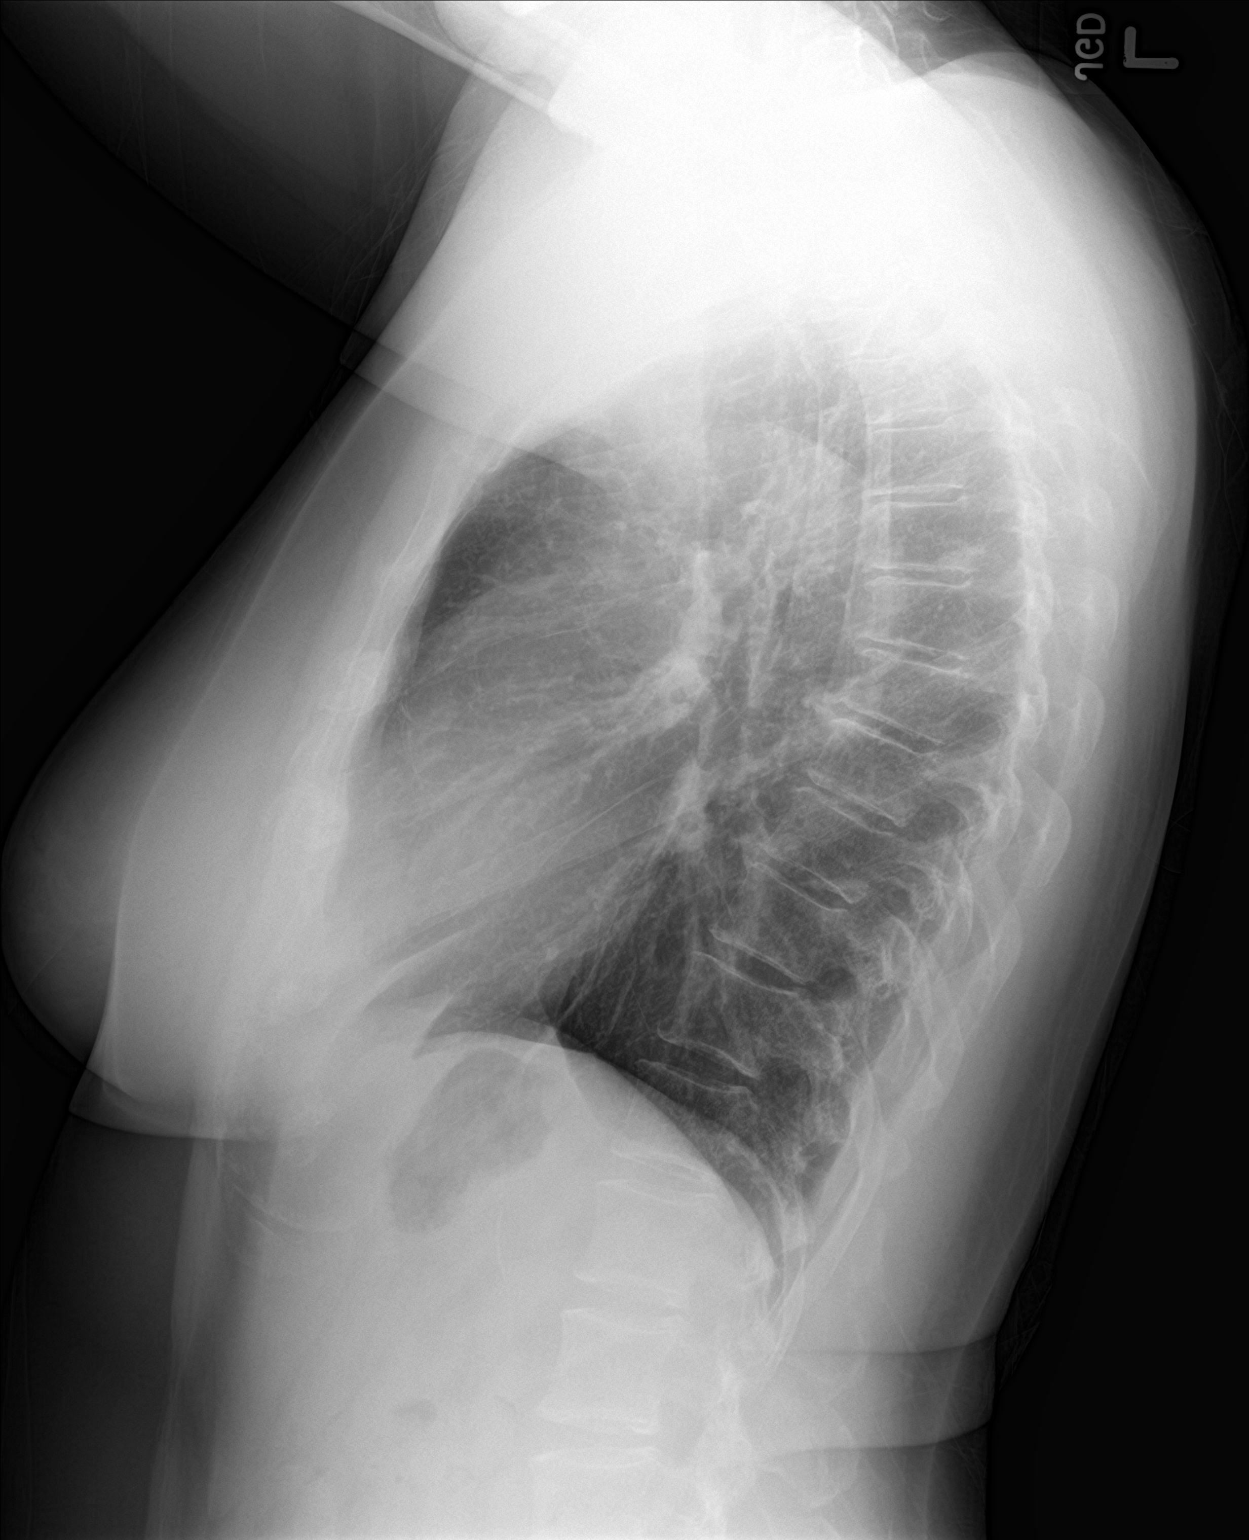

[2 of 2 positions shown; findings below may reference images not displayed]

FINDINGS: The heart size and mediastinal contours are within normal limits.
Both lungs are clear. The visualized skeletal structures are
unremarkable.
IMPRESSION: No active cardiopulmonary disease.

## 2023-04-06 ENCOUNTER — Telehealth: Payer: Self-pay

## 2023-04-06 NOTE — Telephone Encounter (Signed)
Pt called to schedule sclerotherapy. She had this done here over ten years ago. She has been scheduled for later this month.

## 2023-04-18 ENCOUNTER — Ambulatory Visit: Payer: Self-pay

## 2023-07-21 ENCOUNTER — Other Ambulatory Visit: Payer: Self-pay

## 2023-07-21 ENCOUNTER — Emergency Department (HOSPITAL_COMMUNITY)
Admission: EM | Admit: 2023-07-21 | Discharge: 2023-07-22 | Disposition: A | Discharge status: Home / Self Care | Payer: Commercial Managed Care - HMO | Attending: Emergency Medicine | Admitting: Emergency Medicine

## 2023-07-21 ENCOUNTER — Emergency Department (HOSPITAL_COMMUNITY): Payer: Commercial Managed Care - HMO

## 2023-07-21 DIAGNOSIS — D1621 Benign neoplasm of long bones of right lower limb: Secondary | ICD-10-CM | POA: Insufficient documentation

## 2023-07-21 DIAGNOSIS — M545 Low back pain, unspecified: Secondary | ICD-10-CM | POA: Diagnosis present

## 2023-07-21 DIAGNOSIS — I10 Essential (primary) hypertension: Secondary | ICD-10-CM | POA: Insufficient documentation

## 2023-07-21 DIAGNOSIS — N898 Other specified noninflammatory disorders of vagina: Secondary | ICD-10-CM | POA: Diagnosis not present

## 2023-07-21 DIAGNOSIS — R1031 Right lower quadrant pain: Secondary | ICD-10-CM

## 2023-07-21 DIAGNOSIS — R1030 Lower abdominal pain, unspecified: Secondary | ICD-10-CM | POA: Insufficient documentation

## 2023-07-21 LAB — URINALYSIS, ROUTINE W REFLEX MICROSCOPIC
Bacteria, UA: NONE SEEN
Bilirubin Urine: NEGATIVE
Glucose, UA: NEGATIVE mg/dL
Hgb urine dipstick: NEGATIVE
Ketones, ur: 5 mg/dL — AB
Nitrite: NEGATIVE
Protein, ur: NEGATIVE mg/dL
Specific Gravity, Urine: 1.023 (ref 1.005–1.030)
pH: 5 (ref 5.0–8.0)

## 2023-07-21 NOTE — ED Provider Triage Note (Signed)
Emergency Medicine Provider Triage Evaluation Note  Ebony Valdez , a 62 y.o. female  was evaluated in triage.  Pt complains of lower back pain that radiates to the right side of the hip laterally.  This has been ongoing for some time.  Patient was seen evaluated by her primary care physician where she had labs which was normal.  She denies any weakness or numbness to lower extremities.  Review of Systems  Positive:  Negative: See above   Physical Exam  BP 130/79 (BP Location: Right Arm)   Pulse 77   Temp 98.2 F (36.8 C) (Oral)   Resp 17   SpO2 99%  Gen:   Awake, no distress   Resp:  Normal effort  MSK:   Moves extremities without difficulty  Other:  Mild tenderness to the lumbar spine in the midline and into the right hip laterally.  Medical Decision Making  Medically screening exam initiated at 9:05 PM.  Appropriate orders placed.  Ebony Valdez was informed that the remainder of the evaluation will be completed by another provider, this initial triage assessment does not replace that evaluation, and the importance of remaining in the ED until their evaluation is complete.     Honor Loh [place], [place] 07/21/23 2106

## 2023-07-21 NOTE — ED Triage Notes (Signed)
Patient reports persistent low back pain /low abdominal pain for 4 weeks , denies emesis or diarrhea , no fever or chills.

## 2023-07-22 ENCOUNTER — Emergency Department (HOSPITAL_COMMUNITY): Payer: Commercial Managed Care - HMO

## 2023-07-22 LAB — CBC WITH DIFFERENTIAL/PLATELET
Abs Immature Granulocytes: 0.01 10*3/uL (ref 0.00–0.07)
Basophils Absolute: 0 10*3/uL (ref 0.0–0.1)
Basophils Relative: 0 %
Eosinophils Absolute: 0.1 10*3/uL (ref 0.0–0.5)
Eosinophils Relative: 1 %
HCT: 35.6 % — ABNORMAL LOW (ref 36.0–46.0)
Hemoglobin: 11.7 g/dL — ABNORMAL LOW (ref 12.0–15.0)
Immature Granulocytes: 0 %
Lymphocytes Relative: 51 %
Lymphs Abs: 2.6 10*3/uL (ref 0.7–4.0)
MCH: 29.7 pg (ref 26.0–34.0)
MCHC: 32.9 g/dL (ref 30.0–36.0)
MCV: 90.4 fL (ref 80.0–100.0)
Monocytes Absolute: 0.5 10*3/uL (ref 0.1–1.0)
Monocytes Relative: 10 %
Neutro Abs: 2 10*3/uL (ref 1.7–7.7)
Neutrophils Relative %: 38 %
Platelets: 218 10*3/uL (ref 150–400)
RBC: 3.94 MIL/uL (ref 3.87–5.11)
RDW: 12.4 % (ref 11.5–15.5)
WBC: 5.2 10*3/uL (ref 4.0–10.5)
nRBC: 0 % (ref 0.0–0.2)

## 2023-07-22 LAB — BASIC METABOLIC PANEL
Anion gap: 8 (ref 5–15)
BUN: 16 mg/dL (ref 8–23)
CO2: 27 mmol/L (ref 22–32)
Calcium: 9.3 mg/dL (ref 8.9–10.3)
Chloride: 105 mmol/L (ref 98–111)
Creatinine, Ser: 0.88 mg/dL (ref 0.44–1.00)
GFR, Estimated: >60 mL/min (ref >60)
Glucose, Bld: 103 mg/dL — ABNORMAL HIGH (ref 70–99)
Potassium: 3.9 mmol/L (ref 3.5–5.1)
Sodium: 140 mmol/L (ref 135–145)

## 2023-07-22 LAB — I-STAT CHEM 8, ED
BUN: 15 mg/dL (ref 8–23)
Calcium, Ion: 1.23 mmol/L (ref 1.15–1.40)
Chloride: 104 mmol/L (ref 98–111)
Creatinine, Ser: 0.9 mg/dL (ref 0.44–1.00)
Glucose, Bld: 98 mg/dL (ref 70–99)
HCT: 35 % — ABNORMAL LOW (ref 36.0–46.0)
Hemoglobin: 11.9 g/dL — ABNORMAL LOW (ref 12.0–15.0)
Potassium: 3.7 mmol/L (ref 3.5–5.1)
Sodium: 142 mmol/L (ref 135–145)
TCO2: 26 mmol/L (ref 22–32)

## 2023-07-22 LAB — LIPASE, BLOOD: Lipase: 28 U/L (ref 11–51)

## 2023-07-22 MED ORDER — IOHEXOL 350 MG/ML SOLN
75.0000 mL | Freq: Once | INTRAVENOUS | Status: AC | PRN
  Administered 2023-07-22: 75 mL via INTRAVENOUS

## 2023-07-22 MED ORDER — DEXAMETHASONE SODIUM PHOSPHATE 10 MG/ML IJ SOLN
10.0000 mg | Freq: Once | INTRAMUSCULAR | Status: AC
  Administered 2023-07-22: 10 mg via INTRAVENOUS
  Filled 2023-07-22: qty 1

## 2023-07-22 NOTE — ED Provider Notes (Signed)
EMERGENCY DEPARTMENT AT [facility] Provider Note   CSN: [id number] Arrival date & time: 07/21/23  1958     History  Chief Complaint  Patient presents with   Abdominal Pain   Back Pain    Lynnanne Gire is a 62 y.o. female.  Patient presents to the emergency department complaining of right-sided low back pain with radiation down the right leg to the level of the knee along with abdominal pain across the lower abdomen.  She denies nausea, vomiting, diarrhea, fever, chills, chest pain, shortness of breath.  She does endorse vaginal discharge but states that she had STI testing last week which was negative.  Patient denies any trauma to the low back.  She has been evaluated in the past by orthopedics for the same.  Past medical history significant for thyroid disease, hypertension, cervical degenerative disc disease  HPI     Home Medications Prior to Admission medications   Medication Sig Start Date End Date Taking? Authorizing Provider  benzonatate (TESSALON) 100 MG capsule Take 1-2 capsules (100-200 mg total) by mouth 3 (three) times daily as needed for cough. 12/28/20   Linus Mako B, NP  Diclofenac Sodium 2 % SOLN Apply 1 pump twice daily. 09/15/15   Judi Saa, DO  diphenhydrAMINE (BENADRYL) 25 MG tablet Take 75 mg by mouth at bedtime as needed for itching.    [provider]  Echinacea 500 MG CAPS Take 1 capsule by mouth daily.    [provider]  fluticasone (FLONASE) 50 MCG/ACT nasal spray Place 1 spray into both nostrils daily. 12/28/20   Georgetta Haber, NP  HYDROcodone-acetaminophen (NORCO/VICODIN) 5-325 MG per tablet Take 1 tablet by mouth every 6 (six) hours as needed for pain. 04/23/13   Roxy Horseman, PA-C  ibuprofen (ADVIL,MOTRIN) 600 MG tablet Take 1 tablet (600 mg total) by mouth every 6 (six) hours as needed for pain. 04/23/13   Roxy Horseman, PA-C  Levothyroxine Sodium (SYNTHROID PO) Take by mouth.    [provider]  methocarbamol (ROBAXIN) 500 MG tablet Take 1 tablet (500 mg total) by mouth 2 (two) times daily. 04/23/13   Roxy Horseman, PA-C  Multiple Vitamin (MULTIVITAMIN WITH MINERALS) TABS Take 1 tablet by mouth daily.    [provider]  pseudoephedrine-guaifenesin (MUCINEX D) 60-600 MG per tablet Take 1 tablet by mouth daily as needed for congestion (for allergies).    [provider]  vitamin B-12 (CYANOCOBALAMIN) 1000 MCG tablet Take 1,000 mcg by mouth daily.    [provider]  vitamin C (ASCORBIC ACID) 500 MG tablet Take 500 mg by mouth daily.    [provider]  vitamin E 400 UNIT capsule Take 400 Units by mouth daily.    [provider]      Allergies    Patient has no known allergies.    Review of Systems   Review of Systems  Physical Exam Updated Vital Signs BP 124/79 (BP Location: Right Arm)   Pulse 85   Temp 98.7 F (37.1 C) (Oral)   Resp 18   Ht 5\' 4"  (1.626 m)   Wt 73.9 kg   SpO2 99%   BMI 27.98 kg/m  Physical Exam Vitals and nursing note reviewed.  Constitutional:      General: She is not in acute distress.    Appearance: She is well-developed.  HENT:     Head: Normocephalic and atraumatic.  Eyes:     Conjunctiva/sclera: Conjunctivae normal.  Cardiovascular:  Rate and Rhythm: Normal rate and regular rhythm.     Heart sounds: No murmur heard. Pulmonary:     Effort: Pulmonary effort is normal. No respiratory distress.     Breath sounds: Normal breath sounds.  Abdominal:     Palpations: Abdomen is soft.     Tenderness: There is abdominal tenderness in the right lower quadrant, suprapubic area and left lower quadrant.  Musculoskeletal:        General: Tenderness (Right lower lumbar region, no midline tenderness) present. No swelling.     Cervical back: Neck supple.     Comments: No groin pain with passive range of motion of the right hip  Skin:    General: Skin is warm and dry.     Capillary Refill:  Capillary refill takes less than 2 seconds.  Neurological:     Mental Status: She is alert.  Psychiatric:        Mood and Affect: Mood normal.     ED Results / Procedures / Treatments   Labs (all labs ordered are listed, but only abnormal results are displayed) Labs Reviewed  URINALYSIS, ROUTINE W REFLEX MICROSCOPIC - Abnormal; Notable for the following components:      Result Value   APPearance HAZY (*)    Ketones, ur 5 (*)    Leukocytes,Ua MODERATE (*)    All other components within normal limits  BASIC METABOLIC PANEL - Abnormal; Notable for the following components:   Glucose, Bld 103 (*)    All other components within normal limits  CBC WITH DIFFERENTIAL/PLATELET - Abnormal; Notable for the following components:   Hemoglobin 11.7 (*)    HCT 35.6 (*)    All other components within normal limits  I-STAT CHEM 8, ED - Abnormal; Notable for the following components:   Hemoglobin 11.9 (*)    HCT 35.0 (*)    All other components within normal limits  LIPASE, BLOOD    EKG None  Radiology CT L-SPINE NO CHARGE  Result Date: 07/22/2023 CLINICAL DATA:  Abdominal pain EXAM: CT LUMBAR SPINE WITHOUT CONTRAST TECHNIQUE: Multidetector CT imaging of the lumbar spine was performed without intravenous contrast administration. Multiplanar CT image reconstructions were also generated. RADIATION DOSE REDUCTION: This exam was performed according to the departmental dose-optimization program which includes automated exposure control, adjustment of the mA and/or kV according to patient size and/or use of iterative reconstruction technique. COMPARISON:  X-ray lumbar spine 07/21/2023 FINDINGS: Segmentation: 5 lumbar type vertebrae. Alignment: Normal. Vertebrae: No acute fracture or focal pathologic process. Paraspinal and other soft tissues: Negative. Disc levels: Maintained. IMPRESSION: 1. No acute displaced fracture or traumatic listhesis of the lumbar spine. 2. Please see separately dictated CT  abdomen pelvis 07/22/2023. Electronically Signed   By: Tish Frederickson M.D.   On: 07/22/2023 03:44   CT ABDOMEN PELVIS W CONTRAST  Result Date: 07/22/2023 CLINICAL DATA:  Abdominal pain, acute, nonlocalized EXAM: CT ABDOMEN AND PELVIS WITH CONTRAST TECHNIQUE: Multidetector CT imaging of the abdomen and pelvis was performed using the standard protocol following bolus administration of intravenous contrast. RADIATION DOSE REDUCTION: This exam was performed according to the departmental dose-optimization program which includes automated exposure control, adjustment of the mA and/or kV according to patient size and/or use of iterative reconstruction technique. CONTRAST:  75mL OMNIPAQUE IOHEXOL 350 MG/ML SOLN COMPARISON:  None Available. FINDINGS: Lower chest: No acute abnormality. Hepatobiliary: A 2.9 cm lobulated fluid density lesion within the left hepatic lobe likely represents a simple hepatic cyst. No gallstones, gallbladder wall  thickening, or pericholecystic fluid. No biliary dilatation. Pancreas: No focal lesion. Normal pancreatic contour. No surrounding inflammatory changes. No main pancreatic ductal dilatation. Spleen: Normal in size without focal abnormality. Adrenals/Urinary Tract: No adrenal nodule bilaterally. Bilateral kidneys enhance symmetrically. Right renal simple fluid dense lesion suggestive of a simple renal cyst. Simple renal cysts, in the absence of clinically indicated signs/symptoms, require no independent follow-up. No hydronephrosis. No hydroureter.  No nephroureterolithiasis. The urinary bladder is unremarkable. On delayed imaging, there is no urothelial wall thickening and there are no filling defects in the opacified portions of the bilateral collecting systems or ureters. Stomach/Bowel: Stomach is within normal limits. No evidence of bowel wall thickening or dilatation. Stool throughout the ascending and transverse colon. Appendix appears normal. Vascular/Lymphatic: No abdominal aorta  or iliac aneurysm. Mild atherosclerotic plaque of the aorta and its branches. No abdominal, pelvic, or inguinal lymphadenopathy. Reproductive: Partially calcified posterior uterine wall 3 cm lesion consistent with a fibroid. Otherwise uterus and bilateral adnexa are unremarkable. Other: No intraperitoneal free fluid. No intraperitoneal free gas. No organized fluid collection. Musculoskeletal: No abdominal wall hernia or abnormality. Well-defined lytic lesion of the right femoral head with associated punctate middle sclerotic density suggestive of an osteoid osteoma. No suspicious lytic or blastic osseous lesions. No acute displaced fracture. Please see separately dictated CT lumbar spine 07/22/2023. IMPRESSION: 1. No acute intra-abdominal or intrapelvic abnormality. 2. Uterine fibroid. 3. 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 4.  Aortic Atherosclerosis (ICD10-I70.0). Electronically Signed   By: Tish Frederickson M.D.   On: 07/22/2023 03:39   DG Lumbar Spine Complete  Result Date: 07/21/2023 CLINICAL DATA:  low back pain with radiation in the hip EXAM: LUMBAR SPINE - COMPLETE 4+ VIEW COMPARISON:  None Available. FINDINGS: Limited evaluation due to overlapping osseous structures and overlying soft tissues. There is no evidence of lumbar spine fracture. Mild degenerative changes of the spine. Alignment is normal. Intervertebral disc spaces are maintained. IMPRESSION: No acute displaced fracture or traumatic listhesis of the lumbar spine. Electronically Signed   By: Tish Frederickson M.D.   On: 07/21/2023 22:10    Procedures Procedures    Medications Ordered in ED Medications  dexamethasone (DECADRON) injection 10 mg (has no administration in time range)  iohexol (OMNIPAQUE) 350 MG/ML injection 75 mL (75 mLs Intravenous Contrast Given 07/22/23 0323)    ED Course/ Medical Decision Making/ A&P                                 Medical Decision Making Amount and/or Complexity of Data Reviewed Labs:  ordered. Radiology: ordered.  Risk Prescription drug management.   This patient presents to the ED for concern of abdominal pain and back pain, this involves an extensive number of treatment options, and is a complaint that carries with it a high risk of complications and morbidity.  The differential diagnosis includes colitis, appendicitis, lumbar radiculopathy, arthritic changes, cholecystitis, colitis, others   Co morbidities that complicate the patient evaluation  Hypertension   Additional history obtained:  External records from outside source obtained and reviewed including primary care notes from last week   Lab Tests:  I Ordered, and personally interpreted labs.  The pertinent results include: Grossly unremarkable CBC, BMP, lipase.  UA with moderate leukocytes   Imaging Studies ordered:  I ordered imaging studies including plain films of the lumbar spine, CT abdomen pelvis with contrast with lumbar spine read I independently visualized and interpreted  imaging which showed  1. No acute intra-abdominal or intrapelvic abnormality.  2. Uterine fibroid.  3. 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  4.  Aortic Atherosclerosis   1. No acute displaced fracture or traumatic listhesis of the lumbar  spine.   I agree with the radiologist interpretation   Problem List / ED Course / Critical interventions / Medication management   I ordered medication including Decadron for inflammation Reevaluation of the patient after these medicines showed that the patient improved I have reviewed the patients home medicines and have made adjustments as needed   Test / Admission - Considered:  The patient has an osteoid osteoma in the right femoral head but no other acute findings to explain her right sided groin/hip pain.  No acute intra-abdominal or intrapelvic abnormality noted.  Patient very stable, ambulatory, ready to go home.  No nausea, vomiting, diarrhea.  Vitals are stable.   Plan to discharge home with recommendation for further follow-up with orthopedics as needed.         Final Clinical Impression(s) / ED Diagnoses Final diagnoses:  Osteoid osteoma of femur, right  Right groin pain    Rx / DC Orders ED Discharge Orders     None         Pamala Duffel 07/22/23 0500    Gilda Crease, MD 07/22/23 [phone]

## 2023-07-22 NOTE — Discharge Instructions (Signed)
Your workup today was reassuring.  Your CT scan does show a possible osteoid osteoma in the right femoral head.  These are typically benign bone tumors but could potentially cause pain and discomfort and I recommend following up with your orthopedic provider for further evaluation as needed.

## 2023-08-30 ENCOUNTER — Ambulatory Visit (HOSPITAL_COMMUNITY)
Admission: RE | Admit: 2023-08-30 | Discharge: 2023-08-30 | Disposition: A | Discharge status: Home / Self Care | Payer: Commercial Managed Care - HMO | Source: Ambulatory Visit | Attending: Family Medicine | Admitting: Family Medicine

## 2023-08-30 ENCOUNTER — Encounter (HOSPITAL_COMMUNITY): Payer: Self-pay | Admitting: *Deleted

## 2023-08-30 ENCOUNTER — Emergency Department (HOSPITAL_COMMUNITY): Payer: Commercial Managed Care - HMO

## 2023-08-30 ENCOUNTER — Other Ambulatory Visit: Payer: Self-pay

## 2023-08-30 ENCOUNTER — Encounter (HOSPITAL_COMMUNITY): Payer: Self-pay

## 2023-08-30 ENCOUNTER — Emergency Department (HOSPITAL_COMMUNITY)
Admission: EM | Admit: 2023-08-30 | Discharge: 2023-08-31 | Disposition: A | Discharge status: Home / Self Care | Payer: Commercial Managed Care - HMO | Attending: Emergency Medicine | Admitting: Emergency Medicine

## 2023-08-30 VITALS — BP 146/70 | HR 68 | Temp 98.2°F | Resp 18

## 2023-08-30 DIAGNOSIS — L03113 Cellulitis of right upper limb: Secondary | ICD-10-CM | POA: Diagnosis not present

## 2023-08-30 DIAGNOSIS — L089 Local infection of the skin and subcutaneous tissue, unspecified: Secondary | ICD-10-CM

## 2023-08-30 DIAGNOSIS — M79641 Pain in right hand: Secondary | ICD-10-CM | POA: Diagnosis present

## 2023-08-30 DIAGNOSIS — S60511A Abrasion of right hand, initial encounter: Secondary | ICD-10-CM | POA: Diagnosis not present

## 2023-08-30 DIAGNOSIS — M24241 Disorder of ligament, right hand: Secondary | ICD-10-CM | POA: Diagnosis not present

## 2023-08-30 LAB — COMPREHENSIVE METABOLIC PANEL
ALT: 14 U/L (ref 0–44)
AST: 15 U/L (ref 15–41)
Albumin: 4 g/dL (ref 3.5–5.0)
Alkaline Phosphatase: 54 U/L (ref 38–126)
Anion gap: 7 (ref 5–15)
BUN: 15 mg/dL (ref 8–23)
CO2: 24 mmol/L (ref 22–32)
Calcium: 9.2 mg/dL (ref 8.9–10.3)
Chloride: 107 mmol/L (ref 98–111)
Creatinine, Ser: 1.2 mg/dL — ABNORMAL HIGH (ref 0.44–1.00)
GFR, Estimated: 51 mL/min — ABNORMAL LOW (ref >60)
Glucose, Bld: 116 mg/dL — ABNORMAL HIGH (ref 70–99)
Potassium: 3.7 mmol/L (ref 3.5–5.1)
Sodium: 138 mmol/L (ref 135–145)
Total Bilirubin: 0.4 mg/dL (ref 0.3–1.2)
Total Protein: 7 g/dL (ref 6.5–8.1)

## 2023-08-30 LAB — CBC WITH DIFFERENTIAL/PLATELET
Abs Immature Granulocytes: 0.01 10*3/uL (ref 0.00–0.07)
Basophils Absolute: 0 10*3/uL (ref 0.0–0.1)
Basophils Relative: 0 %
Eosinophils Absolute: 0.2 10*3/uL (ref 0.0–0.5)
Eosinophils Relative: 3 %
HCT: 34.8 % — ABNORMAL LOW (ref 36.0–46.0)
Hemoglobin: 11.5 g/dL — ABNORMAL LOW (ref 12.0–15.0)
Immature Granulocytes: 0 %
Lymphocytes Relative: 23 %
Lymphs Abs: 1.4 10*3/uL (ref 0.7–4.0)
MCH: 30.3 pg (ref 26.0–34.0)
MCHC: 33 g/dL (ref 30.0–36.0)
MCV: 91.8 fL (ref 80.0–100.0)
Monocytes Absolute: 0.5 10*3/uL (ref 0.1–1.0)
Monocytes Relative: 8 %
Neutro Abs: 4 10*3/uL (ref 1.7–7.7)
Neutrophils Relative %: 66 %
Platelets: 179 10*3/uL (ref 150–400)
RBC: 3.79 MIL/uL — ABNORMAL LOW (ref 3.87–5.11)
RDW: 12.6 % (ref 11.5–15.5)
WBC: 6.1 10*3/uL (ref 4.0–10.5)
nRBC: 0 % (ref 0.0–0.2)

## 2023-08-30 LAB — I-STAT CG4 LACTIC ACID, ED: Lactic Acid, Venous: 0.3 mmol/L — ABNORMAL LOW (ref 0.5–1.9)

## 2023-08-30 MED ORDER — LIDOCAINE HCL (PF) 1 % IJ SOLN
30.0000 mL | Freq: Once | INTRAMUSCULAR | Status: AC
  Administered 2023-08-30: 30 mL via INTRADERMAL
  Filled 2023-08-30: qty 30

## 2023-08-30 MED ORDER — FENTANYL CITRATE PF 50 MCG/ML IJ SOSY
50.0000 ug | PREFILLED_SYRINGE | Freq: Once | INTRAMUSCULAR | Status: AC
  Administered 2023-08-30: 50 ug via INTRAMUSCULAR
  Filled 2023-08-30: qty 1

## 2023-08-30 NOTE — Discharge Instructions (Addendum)
Patient will proceed to the emergency room

## 2023-08-30 NOTE — ED Triage Notes (Signed)
Pt states that last week she was seen for her right hand pain and given antibiotics which aren't helping. She seen then over the weekend. The pain started when she was working outside digging with a shovel week 08/15/2023  She is having some right hand pain ans swelling. She states she poked her "blister" with a needle a couple days ago. Before [facility] wen [facility]. She has been taking 800mg  IBU

## 2023-08-30 NOTE — ED Triage Notes (Signed)
Pt reporting approx 1.5 weeks ago she was digging with a shovel, and she had blisters form in the right palm area, she used a pin to poke a hole in the blister to drain it. She went to [facility] and was started on abx-has 3 more days left of treatment. No improvement in the swelling and pain in the right hand. Denies fevers.

## 2023-08-30 NOTE — ED Notes (Signed)
Patient is being discharged from the Urgent Care and sent to the Emergency Department via POV . Per Loreta Ave MD, patient is in need of higher level of care due to cellulitis. Patient is aware and verbalizes understanding of plan of care.  Vitals:   08/30/23 1824  BP: (!) 146/70  Pulse: 68  Resp: 18  Temp: 98.2 F (36.8 C)  SpO2: 98%

## 2023-08-30 NOTE — ED Provider Notes (Addendum)
[facility]    CSN: [id number] Arrival date & time: 08/30/23  1655      History   Chief Complaint Chief Complaint  Patient presents with   Hand Pain    HPI Ebony Valdez is a 62 y.o. female.    Hand Pain  Here for pain and swelling in her right hand.  About 2 weeks ago she was outside and doing yard work when she scraped her hand with a shovel.  About 4 or 5 days prior to this visit she started having swelling and redness and pain in her hand and she was seen by her primary care on the 21st.  She has begun on Bactrim, but is continued to swell and hurt worse and worse.  No fever or chills  Of note her last tetanus was in July of this year, found in care everywhere in those notes.  Past Medical History:  Diagnosis Date   Chest pain    Degenerative disc disease, cervical 09/15/2015   Mild C5-C6    Hypertension    situational   Nonallopathic lesion of cervical region 09/15/2015   Nonallopathic lesion of thoracic region 09/15/2015   Nonallopathic lesion-rib cage 09/15/2015   Thyroid disease     Patient Active Problem List   Diagnosis Date Noted   Degenerative disc disease, cervical 09/15/2015   Nonallopathic lesion of cervical region 09/15/2015   Nonallopathic lesion-rib cage 09/15/2015   Nonallopathic lesion of thoracic region 09/15/2015    History reviewed. No pertinent surgical history.  OB History   No obstetric history on file.      Home Medications    Prior to Admission medications   Medication Sig Start Date End Date Taking? Authorizing Provider  BACTRIM DS 800-160 MG tablet 1 tablet Orally Twice a day for 10 day(s) 08/27/23  Yes [provider]  Cholecalciferol (VITAMIN D) 50 MCG (2000 UT) CAPS 1 capsule Orally Twice a day   Yes [provider]  Echinacea 500 MG CAPS Take 1 capsule by mouth daily.   Yes [provider]  vitamin B-12 (CYANOCOBALAMIN) 1000 MCG tablet Take 1,000 mcg by mouth daily.   Yes [provider]  vitamin C (ASCORBIC ACID) 500 MG tablet Take 500 mg by mouth daily.   Yes [provider]  vitamin E 400 UNIT capsule Take 400 Units by mouth daily.   Yes [provider]  benzonatate (TESSALON) 100 MG capsule Take 1-2 capsules (100-200 mg total) by mouth 3 (three) times daily as needed for cough. 12/28/20   Linus Mako B, NP  Diclofenac Sodium 2 % SOLN Apply 1 pump twice daily. 09/15/15   Judi Saa, DO  diphenhydrAMINE (BENADRYL) 25 MG tablet Take 75 mg by mouth at bedtime as needed for itching.    [provider]  fluticasone (FLONASE) 50 MCG/ACT nasal spray Place 1 spray into both nostrils daily. 12/28/20   Georgetta Haber, NP  HYDROcodone-acetaminophen (NORCO/VICODIN) 5-325 MG per tablet Take 1 tablet by mouth every 6 (six) hours as needed for pain. 04/23/13   Roxy Horseman, PA-C  ibuprofen (ADVIL,MOTRIN) 600 MG tablet Take 1 tablet (600 mg total) by mouth every 6 (six) hours as needed for pain. 04/23/13   Roxy Horseman, PA-C  Levothyroxine Sodium (SYNTHROID PO) Take by mouth.    [provider]  methocarbamol (ROBAXIN) 500 MG tablet Take 1 tablet (500 mg total) by mouth 2 (two) times daily. 04/23/13   Roxy Horseman, PA-C  Multiple Vitamin (MULTIVITAMIN  WITH MINERALS) TABS Take 1 tablet by mouth daily.    [provider]  pseudoephedrine-guaifenesin (MUCINEX D) 60-600 MG per tablet Take 1 tablet by mouth daily as needed for congestion (for allergies).    [provider]    Family History History reviewed. No pertinent family history.  Social History Social History   Tobacco Use   Smoking status: Never   Smokeless tobacco: Never  Vaping Use   Vaping status: Never Used  Substance Use Topics   Alcohol use: Yes    Comment: occasionally   Drug use: No     Allergies   Patient has no known allergies.   Review of Systems Review of Systems   Physical Exam Triage Vital Signs ED Triage Vitals   Encounter Vitals Group     BP 08/30/23 1824 (!) 146/70     Systolic BP Percentile --      Diastolic BP Percentile --      Pulse Rate 08/30/23 1824 68     Resp 08/30/23 1824 18     Temp 08/30/23 1824 98.2 F (36.8 C)     Temp Source 08/30/23 1824 Oral     SpO2 08/30/23 1824 98 %     Weight --      Height --      Head Circumference --      Peak Flow --      Pain Score 08/30/23 1820 10     Pain Loc --      Pain Education --      Exclude from Growth Chart --    No data found.  Updated Vital Signs BP (!) 146/70 (BP Location: Left Arm)   Pulse 68   Temp 98.2 F (36.8 C) (Oral)   Resp 18   SpO2 98%   Visual Acuity Right Eye Distance:   Left Eye Distance:   Bilateral Distance:    Right Eye Near:   Left Eye Near:    Bilateral Near:     Physical Exam Vitals reviewed.  Constitutional:      General: She is not in acute distress.    Appearance: She is not ill-appearing, toxic-appearing or diaphoretic.  Musculoskeletal:     Comments: Her right hand is swollen, especially around the palmar aspect at her MCPs proximal to the third and fourth digits.  There is some fluctuance consistent with some pus formation.  She is also swollen into her second third and fourth digits.  There is some erythema more proximally on her palm also.  Skin:    Coloration: Skin is not pale.  Neurological:     Mental Status: She is alert and oriented to person, place, and time.  Psychiatric:        Behavior: Behavior normal.      UC Treatments / Results  Labs (all labs ordered are listed, but only abnormal results are displayed) Labs Reviewed - No data to display  EKG   Radiology No results found.  Procedures Procedures (including critical care time)  Medications Ordered in UC Medications - No data to display  Initial Impression / Assessment and Plan / UC Course  I have reviewed the triage vital signs and the nursing notes.  Pertinent labs & imaging results that were available  during my care of the patient were reviewed by me and considered in my medical decision making (see chart for details).        I am concerned that she has a deep hand infection.  I  have asked her to proceed to the emergency room for higher level of evaluation and treatment that we can provide here in the urgent care setting.  She will proceed by private car. Final Clinical Impressions(s) / UC Diagnoses   Final diagnoses:  Infection of right hand     Discharge Instructions      Patient will proceed to the emergency room     ED Prescriptions   None    PDMP not reviewed this encounter.   Zenia Resides, MD 08/30/23 1851    Zenia Resides, MD 08/30/23 [phone]

## 2023-08-31 ENCOUNTER — Telehealth: Payer: Self-pay | Admitting: Orthopedic Surgery

## 2023-08-31 DIAGNOSIS — S60511A Abrasion of right hand, initial encounter: Secondary | ICD-10-CM | POA: Diagnosis not present

## 2023-08-31 MED ORDER — IBUPROFEN 800 MG PO TABS: 800.0000 mg | ORAL_TABLET | Freq: Three times a day (TID) | ORAL | 0 refills | Status: AC

## 2023-08-31 MED ORDER — CEFTRIAXONE SODIUM 1 G IJ SOLR
500.0000 mg | Freq: Once | INTRAMUSCULAR | Status: AC
  Administered 2023-08-31: 500 mg via INTRAMUSCULAR
  Filled 2023-08-31: qty 10

## 2023-08-31 MED ORDER — CEFTRIAXONE SODIUM 1 G IJ SOLR: 1.0000 g | Freq: Once | INTRAMUSCULAR | Status: DC

## 2023-08-31 NOTE — Telephone Encounter (Signed)
Pt stated she went to ed and had a procedure done on her RT hand on 08/30/23, she was told to f/u with our office due to a huge amount of infection that was in her hand before procedure. If there is appt availability any sooner, pt best call back # [phone]

## 2023-09-02 ENCOUNTER — Ambulatory Visit (HOSPITAL_BASED_OUTPATIENT_CLINIC_OR_DEPARTMENT_OTHER): Payer: Commercial Managed Care - HMO | Admitting: Anesthesiology

## 2023-09-02 ENCOUNTER — Ambulatory Visit (HOSPITAL_COMMUNITY): Payer: Commercial Managed Care - HMO | Admitting: Anesthesiology

## 2023-09-02 ENCOUNTER — Encounter (HOSPITAL_COMMUNITY)
Disposition: A | Discharge status: Home / Self Care | Payer: Self-pay | Source: Ambulatory Visit | Attending: Internal Medicine

## 2023-09-02 ENCOUNTER — Other Ambulatory Visit: Payer: Self-pay

## 2023-09-02 ENCOUNTER — Inpatient Hospital Stay (HOSPITAL_COMMUNITY)
Admit: 2023-09-02 | Discharge: 2023-09-04 | DRG: 513 | Disposition: A | Discharge status: Home / Self Care | Payer: Commercial Managed Care - HMO | Source: Ambulatory Visit | Attending: Internal Medicine | Admitting: Internal Medicine

## 2023-09-02 DIAGNOSIS — S60511A Abrasion of right hand, initial encounter: Secondary | ICD-10-CM | POA: Diagnosis not present

## 2023-09-02 DIAGNOSIS — X58XXXA Exposure to other specified factors, initial encounter: Secondary | ICD-10-CM | POA: Diagnosis present

## 2023-09-02 DIAGNOSIS — E039 Hypothyroidism, unspecified: Secondary | ICD-10-CM | POA: Diagnosis present

## 2023-09-02 DIAGNOSIS — Y92007 Garden or yard of unspecified non-institutional (private) residence as the place of occurrence of the external cause: Secondary | ICD-10-CM

## 2023-09-02 DIAGNOSIS — I10 Essential (primary) hypertension: Secondary | ICD-10-CM | POA: Diagnosis present

## 2023-09-02 DIAGNOSIS — Y93H1 Activity, digging, shoveling and raking: Secondary | ICD-10-CM

## 2023-09-02 DIAGNOSIS — L02511 Cutaneous abscess of right hand: Secondary | ICD-10-CM | POA: Diagnosis present

## 2023-09-02 DIAGNOSIS — M24241 Disorder of ligament, right hand: Principal | ICD-10-CM | POA: Diagnosis present

## 2023-09-02 DIAGNOSIS — L089 Local infection of the skin and subcutaneous tissue, unspecified: Secondary | ICD-10-CM | POA: Diagnosis not present

## 2023-09-02 DIAGNOSIS — Z79899 Other long term (current) drug therapy: Secondary | ICD-10-CM

## 2023-09-02 DIAGNOSIS — Z79890 Hormone replacement therapy (postmenopausal): Secondary | ICD-10-CM

## 2023-09-02 HISTORY — PX: INCISION AND DRAINAGE OF WOUND: SHX1803

## 2023-09-02 SURGERY — IRRIGATION AND DEBRIDEMENT WOUND
Anesthesia: General | Site: Hand | Laterality: Right

## 2023-09-02 MED ORDER — FENTANYL CITRATE (PF) 250 MCG/5ML IJ SOLN
INTRAMUSCULAR | Status: AC
  Filled 2023-09-02: qty 5

## 2023-09-02 MED ORDER — LIDOCAINE 2% (20 MG/ML) 5 ML SYRINGE
INTRAMUSCULAR | Status: DC | PRN
  Administered 2023-09-02: 40 mg via INTRAVENOUS

## 2023-09-02 MED ORDER — PHENYLEPHRINE 80 MCG/ML (10ML) SYRINGE FOR IV PUSH (FOR BLOOD PRESSURE SUPPORT)
PREFILLED_SYRINGE | INTRAVENOUS | Status: AC
  Filled 2023-09-02: qty 10

## 2023-09-02 MED ORDER — MIDAZOLAM HCL 2 MG/2ML IJ SOLN
INTRAMUSCULAR | Status: DC | PRN
  Administered 2023-09-02: 2 mg via INTRAVENOUS

## 2023-09-02 MED ORDER — ONDANSETRON HCL 4 MG/2ML IJ SOLN
INTRAMUSCULAR | Status: DC | PRN
  Administered 2023-09-02: 4 mg via INTRAVENOUS

## 2023-09-02 MED ORDER — VANCOMYCIN HCL 1500 MG/300ML IV SOLN
1500.0000 mg | INTRAVENOUS | Status: AC
  Administered 2023-09-03: 1500 mg via INTRAVENOUS
  Filled 2023-09-02: qty 300

## 2023-09-02 MED ORDER — HYDROMORPHONE HCL 1 MG/ML IJ SOLN
INTRAMUSCULAR | Status: AC
  Filled 2023-09-02: qty 1

## 2023-09-02 MED ORDER — PROMETHAZINE HCL 25 MG/ML IJ SOLN
INTRAMUSCULAR | Status: AC
  Filled 2023-09-02: qty 1

## 2023-09-02 MED ORDER — PROPOFOL 10 MG/ML IV BOLUS
INTRAVENOUS | Status: AC
  Filled 2023-09-02: qty 20

## 2023-09-02 MED ORDER — MIDAZOLAM HCL 2 MG/2ML IJ SOLN
INTRAMUSCULAR | Status: AC
  Filled 2023-09-02: qty 2

## 2023-09-02 MED ORDER — PROPOFOL 10 MG/ML IV BOLUS
INTRAVENOUS | Status: DC | PRN
Start: 2023-09-02 — End: 2023-09-02
  Administered 2023-09-02: 200 mg via INTRAVENOUS

## 2023-09-02 MED ORDER — OXYCODONE-ACETAMINOPHEN 5-325 MG PO TABS
1.0000 | ORAL_TABLET | Freq: Four times a day (QID) | ORAL | Status: DC | PRN
  Administered 2023-09-03: 2 via ORAL
  Administered 2023-09-03 (×2): 1 via ORAL
  Administered 2023-09-03 – 2023-09-04 (×2): 2 via ORAL
  Filled 2023-09-02: qty 2
  Filled 2023-09-02 (×2): qty 1
  Filled 2023-09-02 (×2): qty 2

## 2023-09-02 MED ORDER — ONDANSETRON HCL 4 MG/2ML IJ SOLN
INTRAMUSCULAR | Status: AC
  Filled 2023-09-02: qty 6

## 2023-09-02 MED ORDER — MEPERIDINE HCL 25 MG/ML IJ SOLN: 6.2500 mg | INTRAMUSCULAR | Status: DC | PRN

## 2023-09-02 MED ORDER — ACETAMINOPHEN 650 MG RE SUPP: 650.0000 mg | Freq: Four times a day (QID) | RECTAL | Status: DC | PRN

## 2023-09-02 MED ORDER — PROMETHAZINE HCL 25 MG/ML IJ SOLN
6.2500 mg | INTRAMUSCULAR | Status: DC | PRN
  Administered 2023-09-02: 6.25 mg via INTRAVENOUS

## 2023-09-02 MED ORDER — DEXAMETHASONE SODIUM PHOSPHATE 10 MG/ML IJ SOLN
INTRAMUSCULAR | Status: DC | PRN
  Administered 2023-09-02: 10 mg via INTRAVENOUS

## 2023-09-02 MED ORDER — OXYCODONE HCL 5 MG/5ML PO SOLN: 5.0000 mg | Freq: Once | ORAL | Status: DC | PRN

## 2023-09-02 MED ORDER — ROCURONIUM BROMIDE 10 MG/ML (PF) SYRINGE
PREFILLED_SYRINGE | INTRAVENOUS | Status: AC
  Filled 2023-09-02: qty 20

## 2023-09-02 MED ORDER — MIDAZOLAM HCL 2 MG/2ML IJ SOLN: 0.5000 mg | Freq: Once | INTRAMUSCULAR | Status: DC | PRN

## 2023-09-02 MED ORDER — HYDROMORPHONE HCL 1 MG/ML IJ SOLN
0.2500 mg | INTRAMUSCULAR | Status: DC | PRN
  Administered 2023-09-02 (×2): 0.5 mg via INTRAVENOUS

## 2023-09-02 MED ORDER — SODIUM CHLORIDE 0.9 % IV SOLN
1.0000 g | INTRAVENOUS | Status: DC
  Administered 2023-09-02 – 2023-09-03 (×2): 1 g via INTRAVENOUS
  Filled 2023-09-02 (×2): qty 10

## 2023-09-02 MED ORDER — ACETAMINOPHEN 325 MG PO TABS
650.0000 mg | ORAL_TABLET | Freq: Four times a day (QID) | ORAL | Status: DC | PRN
  Filled 2023-09-02: qty 2

## 2023-09-02 MED ORDER — OXYCODONE HCL 5 MG PO TABS: 5.0000 mg | ORAL_TABLET | Freq: Once | ORAL | Status: DC | PRN

## 2023-09-02 MED ORDER — ACETAMINOPHEN 10 MG/ML IV SOLN
INTRAVENOUS | Status: AC
  Filled 2023-09-02: qty 100

## 2023-09-02 MED ORDER — SCOPOLAMINE 1 MG/3DAYS TD PT72
MEDICATED_PATCH | TRANSDERMAL | Status: AC
  Filled 2023-09-02: qty 1

## 2023-09-02 MED ORDER — DEXAMETHASONE SODIUM PHOSPHATE 10 MG/ML IJ SOLN
INTRAMUSCULAR | Status: AC
  Filled 2023-09-02: qty 3

## 2023-09-02 MED ORDER — 0.9 % SODIUM CHLORIDE (POUR BTL) OPTIME
TOPICAL | Status: DC | PRN
  Administered 2023-09-02: 1000 mL

## 2023-09-02 MED ORDER — SCOPOLAMINE 1 MG/3DAYS TD PT72
MEDICATED_PATCH | TRANSDERMAL | Status: DC | PRN
  Administered 2023-09-02: 1 via TRANSDERMAL

## 2023-09-02 MED ORDER — LACTATED RINGERS IV SOLN: INTRAVENOUS | Status: DC | PRN

## 2023-09-02 MED ORDER — ACETAMINOPHEN 10 MG/ML IV SOLN
INTRAVENOUS | Status: DC | PRN
  Administered 2023-09-02: 1000 mg via INTRAVENOUS

## 2023-09-02 MED ORDER — FENTANYL CITRATE (PF) 250 MCG/5ML IJ SOLN
INTRAMUSCULAR | Status: DC | PRN
  Administered 2023-09-02 (×2): 50 ug via INTRAVENOUS
  Administered 2023-09-02 (×3): 25 ug via INTRAVENOUS

## 2023-09-02 SURGICAL SUPPLY — 68 items
APL PRP STRL LF DISP 70% ISPRP (MISCELLANEOUS) ×1
BAG COUNTER SPONGE SURGICOUNT (BAG) ×1 IMPLANT
BAG SPNG CNTER NS LX DISP (BAG) ×1
BNDG CMPR 5X2 KNTD ELC UNQ LF (GAUZE/BANDAGES/DRESSINGS) ×1
BNDG CMPR 5X3 KNIT ELC UNQ LF (GAUZE/BANDAGES/DRESSINGS) ×1
BNDG CMPR 75X21 PLY HI ABS (MISCELLANEOUS)
BNDG CMPR 9X4 STRL LF SNTH (GAUZE/BANDAGES/DRESSINGS)
BNDG ELASTIC 2INX 5YD STR LF (GAUZE/BANDAGES/DRESSINGS) IMPLANT
BNDG ELASTIC 2X5.8 VLCR STR LF (GAUZE/BANDAGES/DRESSINGS) ×1 IMPLANT
BNDG ELASTIC 3INX 5YD STR LF (GAUZE/BANDAGES/DRESSINGS) ×1 IMPLANT
BNDG ELASTIC 4X5.8 VLCR STR LF (GAUZE/BANDAGES/DRESSINGS) ×1 IMPLANT
BNDG ESMARK 4X9 LF (GAUZE/BANDAGES/DRESSINGS) IMPLANT
BNDG GAUZE DERMACEA FLUFF 4 (GAUZE/BANDAGES/DRESSINGS) ×2 IMPLANT
BNDG GZE DERMACEA 4 6PLY (GAUZE/BANDAGES/DRESSINGS) ×2
CHLORAPREP W/TINT 26 (MISCELLANEOUS) ×1 IMPLANT
CORD BIPOLAR FORCEPS 12FT (ELECTRODE) ×1 IMPLANT
COVER BACK TABLE 60X90IN (DRAPES) IMPLANT
COVER MAYO STAND STRL (DRAPES) ×1 IMPLANT
COVER SURGICAL LIGHT HANDLE (MISCELLANEOUS) ×1 IMPLANT
CUFF TOURN SGL QUICK 18X4 (TOURNIQUET CUFF) ×1 IMPLANT
CUFF TOURN SGL QUICK 24 (TOURNIQUET CUFF)
CUFF TRNQT CYL 24X4X16.5-23 (TOURNIQUET CUFF) IMPLANT
DRAIN PENROSE 18X1/4 LTX STRL (DRAIN) IMPLANT
DRAPE HALF SHEET 40X57 (DRAPES) ×1 IMPLANT
DRAPE OEC MINIVIEW 54X84 (DRAPES) IMPLANT
DRAPE SURG 17X23 STRL (DRAPES) ×1 IMPLANT
DRSG ADAPTIC 3X8 NADH LF (GAUZE/BANDAGES/DRESSINGS) ×1 IMPLANT
GAUZE SPONGE 4X4 12PLY STRL (GAUZE/BANDAGES/DRESSINGS) ×1 IMPLANT
GAUZE STRETCH 2X75IN STRL (MISCELLANEOUS) IMPLANT
GAUZE XEROFORM 1X8 LF (GAUZE/BANDAGES/DRESSINGS) ×1 IMPLANT
GLOVE BIO SURGEON STRL SZ7 (GLOVE) ×1 IMPLANT
GLOVE SURG UNDER POLY LF SZ7 (GLOVE) ×1 IMPLANT
GOWN STRL REUS W/ TWL XL LVL3 (GOWN DISPOSABLE) ×2 IMPLANT
GOWN STRL REUS W/TWL XL LVL3 (GOWN DISPOSABLE) ×2
IV CATH 18G X1.75 CATHLON (IV SOLUTION) IMPLANT
KIT BASIN OR (CUSTOM PROCEDURE TRAY) ×1 IMPLANT
KIT TURNOVER KIT B (KITS) ×1 IMPLANT
LOOP VASCLR MAXI BLUE 18IN ST (MISCELLANEOUS) IMPLANT
LOOP VASCULAR MAXI 18 BLUE (MISCELLANEOUS)
LOOPS VASCLR MAXI BLUE 18IN ST (MISCELLANEOUS) IMPLANT
MANIFOLD NEPTUNE II (INSTRUMENTS) IMPLANT
NDL HYPO 25GX1X1/2 BEV (NEEDLE) IMPLANT
NDL HYPO 25X1 1.5 SAFETY (NEEDLE) ×1 IMPLANT
NDL KEITH (NEEDLE) IMPLANT
NEEDLE HYPO 25GX1X1/2 BEV (NEEDLE) IMPLANT
NEEDLE HYPO 25X1 1.5 SAFETY (NEEDLE) ×1 IMPLANT
NEEDLE KEITH (NEEDLE) IMPLANT
NS IRRIG 1000ML POUR BTL (IV SOLUTION) ×1 IMPLANT
PACK ORTHO EXTREMITY (CUSTOM PROCEDURE TRAY) ×1 IMPLANT
PAD ABD 8X10 STRL (GAUZE/BANDAGES/DRESSINGS) ×1 IMPLANT
PAD ARMBOARD 7.5X6 YLW CONV (MISCELLANEOUS) ×1 IMPLANT
PAD CAST 4YDX4 CTTN HI CHSV (CAST SUPPLIES) ×2 IMPLANT
PADDING CAST ABS COTTON 3X4 (CAST SUPPLIES) ×1 IMPLANT
PADDING CAST COTTON 4X4 STRL (CAST SUPPLIES) ×2
SPLINT PLASTER CAST XFAST 3X15 (CAST SUPPLIES) ×1 IMPLANT
SPONGE T-LAP 4X18 ~~LOC~~+RFID (SPONGE) ×1 IMPLANT
SUT FIBERWIRE 3-0 18 TAPR NDL (SUTURE)
SUTURE FIBERWR 3-0 18 TAPR NDL (SUTURE) IMPLANT
SWAB CULTURE ESWAB REG 1ML (MISCELLANEOUS) IMPLANT
SYR BULB EAR ULCER 3OZ GRN STR (SYRINGE) ×1 IMPLANT
SYR CONTROL 10ML LL (SYRINGE) IMPLANT
TOWEL GREEN STERILE (TOWEL DISPOSABLE) ×1 IMPLANT
TOWEL GREEN STERILE FF (TOWEL DISPOSABLE) ×2 IMPLANT
TUBE CONNECTING 12X1/4 (SUCTIONS) IMPLANT
UNDERPAD 30X36 HEAVY ABSORB (UNDERPADS AND DIAPERS) ×1 IMPLANT
VASCULAR TIE MAXI BLUE 18IN ST (MISCELLANEOUS)
WATER STERILE IRR 1000ML POUR (IV SOLUTION) ×1 IMPLANT
YANKAUER SUCT BULB TIP NO VENT (SUCTIONS) IMPLANT

## 2023-09-02 NOTE — Op Note (Signed)
   Date of Surgery: 09/02/2023  INDICATIONS: Patient is a 62 y.o.-year-old female with an infection involving the right hand.  Risks, benefits, and alternatives to surgery were again discussed with the patient in the preoperative area. The patient wishes to proceed with surgery.  Informed consent was signed after our discussion.   PREOPERATIVE DIAGNOSIS:  Right hand infection involving third web space (infection tracked between middle and ring metacarpals, deep to transverse metacarpal ligament)  POSTOPERATIVE DIAGNOSIS: Same.  PROCEDURE:  Irrigation and debridement of right hand abscess, deep, complicated (40102)   SURGEON: Waylan Rocher, M.D.  ASSIST: None  ANESTHESIA:  general  IV FLUIDS AND URINE: See anesthesia.  ESTIMATED BLOOD LOSS: <5 mL.  IMPLANTS: * No implants in log *   DRAINS: Penrose x 1  COMPLICATIONS: None  SPECIMENS: Culture swabs from wound  DESCRIPTION OF PROCEDURE: The patient was met in the preoperative holding area where the surgical site was marked and the consent form was signed.  The patient was then taken to the operating room and transferred to the operating table.  All bony prominences were well padded.  A tourniquet was applied to the right forearm.  General endotracheal anesthesia was induced.  The operative extremity was prepped and draped in the usual and sterile fashion.  A formal time-out was performed to confirm that this was the correct patient, surgery, side, and site.   Following formal timeout, the limb was gently exsanguinated by gravity and the tourniquet inflated to 250 mmHg.  I began by making a longitudinal incision around the third webspace of the right hand.  This was the area of maximum fluctuance and frank purulent drainage.  Upon skin incision, there was expression of frank pus.  Blunt dissection with a tenotomy scissor was used to break up any potential loculations.  The infection seem to tract to the dorsal aspect of the hand.   There was some clear, yellow tissue fluid adjacent to the webspace over the ring and middle finger flexor tendons but no frank purulence in this area.  After extensive blunt dissection, the wound was thoroughly irrigated with copious sterile saline.  1/4 inch Penrose drain was then packed into the wound.  The wound was then dressed with Xeroform, 4 x 4's, and a sterile Ace wrap.  The tourniquet was deflated.  The fingers were warm, pink, well-perfused with the tourniquet deflated.  The patient was reversed from anesthesia and extubated uneventfully.  They were transferred from the operating table to the postoperative bed.  All counts were correct x 2 at the end of the procedure.  The patient was then taken to the PACU in stable condition.   POSTOPERATIVE PLAN: She will be admitted to the medicine service for IV antibiotics.  Will start wound care tomorrow with warm, diluted Hibiclens solution.  Will follow-up intra op cultures.  Final dispo pending appearance of the wound and final antibiotic choice.  Waylan Rocher, MD 10:46 PM

## 2023-09-02 NOTE — Transfer of Care (Signed)
Immediate Anesthesia Transfer of Care Note  Patient: Barbera Perritt  Procedure(s) Performed: IRRIGATION AND DEBRIDEMENT HAND (Right: Hand)  Patient Location: PACU  Anesthesia Type:General  Level of Consciousness: awake, alert , and oriented  Airway & Oxygen Therapy: Patient Spontanous Breathing and Patient connected to nasal cannula oxygen  Post-op Assessment: Report given to RN and Post -op Vital signs reviewed and stable  Post vital signs: Reviewed and stable  Last Vitals:  Vitals Value Taken Time  BP 138/86 09/02/23 2257  Temp    Pulse 86 09/02/23 2258  Resp 14 09/02/23 2258  SpO2 96 % 09/02/23 2258  Vitals shown include unfiled device data.  Last Pain: There were no vitals filed for this visit.       Complications: No notable events documented.

## 2023-09-02 NOTE — Assessment & Plan Note (Signed)
POD #0 I+D R hand infection.  Abscess material reportedly sent for culture. Failed outpt bactrim Got 1 dose of 500mg  rocephin in ED on 9/24 it looks like. Putting on rocephin + vanc for tonight Surgical culture pending MRSA PCR nares pending De escalate ABx as able Will add BCx but no sepsis nor other signs of systemic illness at time of admission Checking CBC/CMP

## 2023-09-02 NOTE — Consult Note (Signed)
HAND SURGERY CONSULTATION  REQUESTING PHYSICIAN: [name], MD   Chief Complaint: Right hand infection  HPI: Ebony Valdez is a 62 y.o. female who presents with an infection involving the right hand.  This started after an innocuous injury to her hand two weeks ago while doing yard work.  The swelling has worsened over the last several days.  She presented to the ER on 9/24 where reportedly an I&D was performed.  There are no records of this.  She was seen in my office this afternoon with gross drainage from her palm and diffuse swelling of her hand.  She presents today for formal I&D.  She denies any systemic symptoms.     Past Medical History:  Diagnosis Date   Chest pain    Degenerative disc disease, cervical 09/15/2015   Mild C5-C6    Hypertension    situational   Nonallopathic lesion of cervical region 09/15/2015   Nonallopathic lesion of thoracic region 09/15/2015   Nonallopathic lesion-rib cage 09/15/2015   Thyroid disease    No past surgical history on file. Social History   Socioeconomic History   Marital status: Unknown    Spouse name: Not on file   Number of children: Not on file   Years of education: Not on file   Highest education level: Not on file  Occupational History   Not on file  Tobacco Use   Smoking status: Never   Smokeless tobacco: Never  Vaping Use   Vaping status: Never Used  Substance and Sexual Activity   Alcohol use: Yes    Comment: occasionally   Drug use: No   Sexual activity: Not Currently  Other Topics Concern   Not on file  Social History Narrative   Not on file   Social Determinants of Health   Financial Resource Strain: Not on file  Food Insecurity: Not on file  Transportation Needs: Not on file  Physical Activity: Not on file  Stress: Not on file  Social Connections: Unknown (04/20/2022)   Received from [facility]   Social Network    Social Network: Not on file   No family history on  file. - negative except otherwise stated in the family history section No Known Allergies Prior to Admission medications   Medication Sig Start Date End Date Taking? Authorizing Provider  BACTRIM DS 800-160 MG tablet 1 tablet Orally Twice a day for 10 day(s) 08/27/23   [provider]  benzonatate (TESSALON) 100 MG capsule Take 1-2 capsules (100-200 mg total) by mouth 3 (three) times daily as needed for cough. 12/28/20   Georgetta Haber, NP  Cholecalciferol (VITAMIN D) 50 MCG (2000 UT) CAPS 1 capsule Orally Twice a day    [provider]  Diclofenac Sodium 2 % SOLN Apply 1 pump twice daily. 09/15/15   Judi Saa, DO  diphenhydrAMINE (BENADRYL) 25 MG tablet Take 75 mg by mouth at bedtime as needed for itching.    [provider]  Echinacea 500 MG CAPS Take 1 capsule by mouth daily.    [provider]  fluticasone (FLONASE) 50 MCG/ACT nasal spray Place 1 spray into both nostrils daily. 12/28/20   Georgetta Haber, NP  HYDROcodone-acetaminophen (NORCO/VICODIN) 5-325 MG per tablet Take 1 tablet by mouth every 6 (six) hours as needed for pain. 04/23/13   Roxy Horseman, PA-C  ibuprofen (ADVIL) 800 MG tablet Take 1 tablet (800 mg total) by mouth 3 (three) times daily. 08/31/23   Glyn Ade,  MD  Levothyroxine Sodium (SYNTHROID PO) Take by mouth.    [provider]  methocarbamol (ROBAXIN) 500 MG tablet Take 1 tablet (500 mg total) by mouth 2 (two) times daily. 04/23/13   Roxy Horseman, PA-C  Multiple Vitamin (MULTIVITAMIN WITH MINERALS) TABS Take 1 tablet by mouth daily.    [provider]  pseudoephedrine-guaifenesin (MUCINEX D) 60-600 MG per tablet Take 1 tablet by mouth daily as needed for congestion (for allergies).    [provider]  vitamin B-12 (CYANOCOBALAMIN) 1000 MCG tablet Take 1,000 mcg by mouth daily.    [provider]  vitamin C (ASCORBIC ACID) 500 MG tablet Take 500 mg by mouth daily.    [provider]  vitamin E 400 UNIT capsule Take 400 Units by mouth daily.    [provider]   No results found. - Positive ROS: All other systems have been reviewed and were otherwise negative with the exception of those mentioned in the HPI and as above.  Physical Exam: General: No acute distress, resting comfortably Cardiovascular: BUE warm and well perfused, normal rate Respiratory: Normal WOB on RA Skin: Warm and dry Neurologic: Sensation intact distally Psychiatric: Patient is at baseline mood and affect  Right Upper Extremity  Diffuse swelling of the hand with purulence from the palm in line with the 3rd webspace.  Limited AROM of all fingers secondary to pain and swelling.  SILT m/u/r distribution.  Hand warm and well perfused w/ BCR.     Assessment: 62 yo F w/ right hand infection.   Plan: OR today for formal I&D.  We again reviewed the risks, benefits, and alternatives to surgery in the preoperative holding area with the patient electing to proceed.  Informed consent was signed.  Will plan for medicine admission postoperatively.  Will start wound care tomorrow.  Recommend broad spectrum IV abx tonight with final abx choice pending intra-op cultures.   Thank you for the consult and the opportunity to see Ebony Valdez, M.D. [facility] 9:54 PM

## 2023-09-02 NOTE — H&P (View-Only) (Signed)
HAND SURGERY CONSULTATION  REQUESTING PHYSICIAN: Ebony Beards, MD   Chief Complaint: Right hand infection  HPI: Ebony Valdez is a 62 y.o. female who presents with an infection involving the right hand.  This started after an innocuous injury to her hand two weeks ago while doing yard work.  The swelling has worsened over the last several days.  She presented to the ER on 9/24 where reportedly an I&D was performed.  There are no records of this.  She was seen in my office this afternoon with gross drainage from her palm and diffuse swelling of her hand.  She presents today for formal I&D.  She denies any systemic symptoms.     Past Medical History:  Diagnosis Date   Chest pain    Degenerative disc disease, cervical 09/15/2015   Mild C5-C6    Hypertension    situational   Nonallopathic lesion of cervical region 09/15/2015   Nonallopathic lesion of thoracic region 09/15/2015   Nonallopathic lesion-rib cage 09/15/2015   Thyroid disease    No past surgical history on file. Social History   Socioeconomic History   Marital status: Unknown    Spouse name: Not on file   Number of children: Not on file   Years of education: Not on file   Highest education level: Not on file  Occupational History   Not on file  Tobacco Use   Smoking status: Never   Smokeless tobacco: Never  Vaping Use   Vaping status: Never Used  Substance and Sexual Activity   Alcohol use: Yes    Comment: occasionally   Drug use: No   Sexual activity: Not Currently  Other Topics Concern   Not on file  Social History Narrative   Not on file   Social Determinants of Health   Financial Resource Strain: Not on file  Food Insecurity: Not on file  Transportation Needs: Not on file  Physical Activity: Not on file  Stress: Not on file  Social Connections: Unknown (04/20/2022)   Received from [facility]   Social Network    Social Network: Not on file   No family history on  file. - negative except otherwise stated in the family history section No Known Allergies Prior to Admission medications   Medication Sig Start Date End Date Taking? Authorizing Provider  BACTRIM DS 800-160 MG tablet 1 tablet Orally Twice a day for 10 day(s) 08/27/23   [provider]  benzonatate (TESSALON) 100 MG capsule Take 1-2 capsules (100-200 mg total) by mouth 3 (three) times daily as needed for cough. 12/28/20   Georgetta Haber, NP  Cholecalciferol (VITAMIN D) 50 MCG (2000 UT) CAPS 1 capsule Orally Twice a day    [provider]  Diclofenac Sodium 2 % SOLN Apply 1 pump twice daily. 09/15/15   Judi Saa, DO  diphenhydrAMINE (BENADRYL) 25 MG tablet Take 75 mg by mouth at bedtime as needed for itching.    [provider]  Echinacea 500 MG CAPS Take 1 capsule by mouth daily.    [provider]  fluticasone (FLONASE) 50 MCG/ACT nasal spray Place 1 spray into both nostrils daily. 12/28/20   Georgetta Haber, NP  HYDROcodone-acetaminophen (NORCO/VICODIN) 5-325 MG per tablet Take 1 tablet by mouth every 6 (six) hours as needed for pain. 04/23/13   Roxy Horseman, PA-C  ibuprofen (ADVIL) 800 MG tablet Take 1 tablet (800 mg total) by mouth 3 (three) times daily. 08/31/23   Glyn Ade,  MD  Levothyroxine Sodium (SYNTHROID PO) Take by mouth.    [provider]  methocarbamol (ROBAXIN) 500 MG tablet Take 1 tablet (500 mg total) by mouth 2 (two) times daily. 04/23/13   Roxy Horseman, PA-C  Multiple Vitamin (MULTIVITAMIN WITH MINERALS) TABS Take 1 tablet by mouth daily.    [provider]  pseudoephedrine-guaifenesin (MUCINEX D) 60-600 MG per tablet Take 1 tablet by mouth daily as needed for congestion (for allergies).    [provider]  vitamin B-12 (CYANOCOBALAMIN) 1000 MCG tablet Take 1,000 mcg by mouth daily.    [provider]  vitamin C (ASCORBIC ACID) 500 MG tablet Take 500 mg by mouth daily.    [provider]  vitamin E 400 UNIT capsule Take 400 Units by mouth daily.    [provider]   No results found. - Positive ROS: All other systems have been reviewed and were otherwise negative with the exception of those mentioned in the HPI and as above.  Physical Exam: General: No acute distress, resting comfortably Cardiovascular: BUE warm and well perfused, normal rate Respiratory: Normal WOB on RA Skin: Warm and dry Neurologic: Sensation intact distally Psychiatric: Patient is at baseline mood and affect  Right Upper Extremity  Diffuse swelling of the hand with purulence from the palm in line with the 3rd webspace.  Limited AROM of all fingers secondary to pain and swelling.  SILT m/u/r distribution.  Hand warm and well perfused w/ BCR.     Assessment: 62 yo F w/ right hand infection.   Plan: OR today for formal I&D.  We again reviewed the risks, benefits, and alternatives to surgery in the preoperative holding area with the patient electing to proceed.  Informed consent was signed.  Will plan for medicine admission postoperatively.  Will start wound care tomorrow.  Recommend broad spectrum IV abx tonight with final abx choice pending intra-op cultures.   Thank you for the consult and the opportunity to see Ms. Ebony Valdez, M.D. [facility] 9:54 PM

## 2023-09-02 NOTE — Anesthesia Procedure Notes (Signed)
Procedure Name: LMA Insertion Date/Time: 09/02/2023 10:23 PM  Performed by: Garfield Cornea, CRNAPre-anesthesia Checklist: Patient identified, Emergency Drugs available, Suction available and Patient being monitored Patient Re-evaluated:Patient Re-evaluated prior to induction Oxygen Delivery Method: Circle System Utilized Preoxygenation: Pre-oxygenation with 100% oxygen Induction Type: IV induction LMA: LMA inserted LMA Size: 4.0 Number of attempts: 1 Placement Confirmation: positive ETCO2 Tube secured with: Tape Dental Injury: Teeth and Oropharynx as per pre-operative assessment

## 2023-09-02 NOTE — Anesthesia Preprocedure Evaluation (Addendum)
Anesthesia Evaluation  Patient identified by MRN, date of birth, ID band Patient awake    Reviewed: Allergy & Precautions, NPO status , Patient's Chart, lab work & pertinent test results  History of Anesthesia Complications Negative for: history of anesthetic complications  Airway Mallampati: I  TM Distance: >3 FB Neck ROM: Full    Dental  (+) Teeth Intact, Dental Advisory Given   Pulmonary neg pulmonary ROS   breath sounds clear to auscultation       Cardiovascular hypertension (not on medication presently),  Rhythm:Regular Rate:Normal     Neuro/Psych negative neurological ROS     GI/Hepatic negative GI ROS, Neg liver ROS,,,  Endo/Other  Hypothyroidism    Renal/GU negative Renal ROS     Musculoskeletal   Abdominal   Peds  Hematology negative hematology ROS (+)   Anesthesia Other Findings   Reproductive/Obstetrics                             Anesthesia Physical Anesthesia Plan  ASA: 2  Anesthesia Plan: General   Post-op Pain Management: Tylenol PO (pre-op)*   Induction: Intravenous  PONV Risk Score and Plan: 3 and Ondansetron, Dexamethasone and Scopolamine patch - Pre-op  Airway Management Planned: LMA  Additional Equipment: None  Intra-op Plan:   Post-operative Plan:   Informed Consent: I have reviewed the patients History and Physical, chart, labs and discussed the procedure including the risks, benefits and alternatives for the proposed anesthesia with the patient or authorized representative who has indicated his/her understanding and acceptance.     Dental advisory given  Plan Discussed with: CRNA and Surgeon  Anesthesia Plan Comments:         Anesthesia Quick Evaluation

## 2023-09-02 NOTE — H&P (Signed)
History and Physical    Patient: Ebony Valdez WGN:[id number] DOB: [birth year] DOA: 09/02/2023 DOS: the patient was seen and examined on 09/02/2023 PCP: [name], MD  Patient coming from: Home  Chief Complaint: Hand infection  HPI: Ebony Valdez is a 62 y.o. female with medical history significant of HTN, Hypothyroidism.  Pt scraped hand with shovel ~2 weeks ago while outside doing yard work.  6-7 days ago started having edema, erythema, pain to R hand.  Saw PCP on 21st, started on bactrim, failed outpt bactrim.  Saw UC on 24th, concerned for deep wound infection, sent in to ED.  Not quite sure what happened in ED as other than her triage note, no provider note is visible from 9/24.  Do suspect she was seen however, as looks like she was given rocephin, x1, fentanyl, lidocaine (I think I+D might have been performed in ED?)  Dont see that any outpt ABx prescribed.  Regardless pt seen in ortho office today by Dr. Frazier Butt.  Taken to [facility] for formal I&D.  I am asked to admit pt for ABx post op.   Review of Systems: As mentioned in the history of present illness. All other systems reviewed and are negative. Past Medical History:  Diagnosis Date   Chest pain    Degenerative disc disease, cervical 09/15/2015   Mild C5-C6    Hypertension    situational   Nonallopathic lesion of cervical region 09/15/2015   Nonallopathic lesion of thoracic region 09/15/2015   Nonallopathic lesion-rib cage 09/15/2015   Thyroid disease    No past surgical history on file. Social History:  reports that she has never smoked. She has never used smokeless tobacco. She reports current alcohol use. She reports that she does not use drugs.  No Known Allergies  No family history on file.  Prior to Admission medications   Medication Sig Start Date End Date Taking? Authorizing Provider  BACTRIM DS 800-160 MG tablet 1 tablet Orally Twice a day for 10 day(s) 08/27/23   [provider]  benzonatate  (TESSALON) 100 MG capsule Take 1-2 capsules (100-200 mg total) by mouth 3 (three) times daily as needed for cough. 12/28/20   Georgetta Haber, NP  Cholecalciferol (VITAMIN D) 50 MCG (2000 UT) CAPS 1 capsule Orally Twice a day    [provider]  Diclofenac Sodium 2 % SOLN Apply 1 pump twice daily. 09/15/15   Judi Saa, DO  diphenhydrAMINE (BENADRYL) 25 MG tablet Take 75 mg by mouth at bedtime as needed for itching.    [provider]  Echinacea 500 MG CAPS Take 1 capsule by mouth daily.    [provider]  fluticasone (FLONASE) 50 MCG/ACT nasal spray Place 1 spray into both nostrils daily. 12/28/20   Georgetta Haber, NP  HYDROcodone-acetaminophen (NORCO/VICODIN) 5-325 MG per tablet Take 1 tablet by mouth every 6 (six) hours as needed for pain. 04/23/13   Roxy Horseman, PA-C  ibuprofen (ADVIL) 800 MG tablet Take 1 tablet (800 mg total) by mouth 3 (three) times daily. 08/31/23   Glyn Ade, MD  Levothyroxine Sodium (SYNTHROID PO) Take by mouth.    [provider]  methocarbamol (ROBAXIN) 500 MG tablet Take 1 tablet (500 mg total) by mouth 2 (two) times daily. 04/23/13   Roxy Horseman, PA-C  Multiple Vitamin (MULTIVITAMIN WITH MINERALS) TABS Take 1 tablet by mouth daily.    [provider]  pseudoephedrine-guaifenesin (MUCINEX D) 60-600 MG per tablet Take 1 tablet by mouth daily  as needed for congestion (for allergies).    [provider]  vitamin B-12 (CYANOCOBALAMIN) 1000 MCG tablet Take 1,000 mcg by mouth daily.    [provider]  vitamin C (ASCORBIC ACID) 500 MG tablet Take 500 mg by mouth daily.    [provider]  vitamin E 400 UNIT capsule Take 400 Units by mouth daily.    [provider]    Physical Exam: Vitals:   09/02/23 2300 09/02/23 2315 09/02/23 2330  BP: 136/84 (!) 136/90 130/73  Pulse: 87 85 74  Resp: 18 16 10   Temp: 98.4 F (36.9 C)    SpO2: 96% 96% 98%   Constitutional: sleepy  post-op Respiratory: clear to auscultation bilaterally, no wheezing, no crackles. Normal respiratory effort. No accessory muscle use.  Cardiovascular: Regular rate and rhythm, no murmurs / rubs / gallops. No extremity edema. 2+ pedal pulses. No carotid bruits.  Abdomen: no tenderness, no masses palpated. No hepatosplenomegaly. Bowel sounds positive.  Musculoskeletal: R hand with surgical dressing in place Neurologic: CN 2-12 grossly intact. Sensation intact, DTR normal. Strength 5/5 in all 4.  Psychiatric: Normal judgment and insight. Alert and oriented x 3. Normal mood.   Data Reviewed:    Labs on Admission: I have personally reviewed following labs and imaging studies  CBC: Recent Labs  Lab 08/30/23 1926  WBC 6.1  NEUTROABS 4.0  HGB 11.5*  HCT 34.8*  MCV 91.8  PLT 179   Basic Metabolic Panel: Recent Labs  Lab 08/30/23 1926  NA 138  K 3.7  CL 107  CO2 24  GLUCOSE 116*  BUN 15  CREATININE 1.20*  CALCIUM 9.2   GFR: CrCl cannot be calculated (Unknown ideal weight.). Liver Function Tests: Recent Labs  Lab 08/30/23 1926  AST 15  ALT 14  ALKPHOS 54  BILITOT 0.4  PROT 7.0  ALBUMIN 4.0   No results for input(s): "LIPASE", "AMYLASE" in the last 168 hours. No results for input(s): "AMMONIA" in the last 168 hours. Coagulation Profile: No results for input(s): "INR", "PROTIME" in the last 168 hours. Cardiac Enzymes: No results for input(s): "CKTOTAL", "CKMB", "CKMBINDEX", "TROPONINI" in the last 168 hours. BNP (last 3 results) No results for input(s): "PROBNP" in the last 8760 hours. HbA1C: No results for input(s): "HGBA1C" in the last 72 hours. CBG: No results for input(s): "GLUCAP" in the last 168 hours. Lipid Profile: No results for input(s): "CHOL", "HDL", "LDLCALC", "TRIG", "CHOLHDL", "LDLDIRECT" in the last 72 hours. Thyroid Function Tests: No results for input(s): "TSH", "T4TOTAL", "FREET4", "T3FREE", "THYROIDAB" in the last 72 hours. Anemia Panel: No  results for input(s): "VITAMINB12", "FOLATE", "FERRITIN", "TIBC", "IRON", "RETICCTPCT" in the last 72 hours. Urine analysis:    Component Value Date/Time   COLORURINE YELLOW 07/21/2023 2109   APPEARANCEUR HAZY (A) 07/21/2023 2109   LABSPEC 1.023 07/21/2023 2109   PHURINE 5.0 07/21/2023 2109   GLUCOSEU NEGATIVE 07/21/2023 2109   HGBUR NEGATIVE 07/21/2023 2109   BILIRUBINUR NEGATIVE 07/21/2023 2109   KETONESUR 5 (A) 07/21/2023 2109   PROTEINUR NEGATIVE 07/21/2023 2109   NITRITE NEGATIVE 07/21/2023 2109   LEUKOCYTESUR MODERATE (A) 07/21/2023 2109    Radiological Exams on Admission: No results found.  EKG: Independently reviewed.   Assessment and Plan: * Abrasion of hand with infection POD #0 I+D R hand infection.  Abscess material reportedly sent for culture. Failed outpt bactrim Got 1 dose of 500mg  rocephin in ED on 9/24 it looks like. Putting on rocephin + vanc for tonight Surgical culture pending  MRSA PCR nares pending De escalate ABx as able Will add BCx but no sepsis nor other signs of systemic illness at time of admission Checking CBC/CMP      Advance Care Planning:   Code Status: Full Code Default  Consults: Dr. Frazier Butt  Family Communication: No family in room  Severity of Illness: The appropriate patient status for this patient is OBSERVATION. Observation status is judged to be reasonable and necessary in order to provide the required intensity of service to ensure the patient's safety. The patient's presenting symptoms, physical exam findings, and initial radiographic and laboratory data in the context of their medical condition is felt to place them at decreased risk for further clinical deterioration. Furthermore, it is anticipated that the patient will be medically stable for discharge from the hospital within 2 midnights of admission.   Author: Hillary Bow., DO 09/02/2023 11:41 PM  For on call review [email or web address].

## 2023-09-02 NOTE — Interval H&P Note (Signed)
History and Physical Interval Note:  09/02/2023 10:00 PM  Ebony Valdez  has presented today for surgery, with the diagnosis of Right hand infection.  The various methods of treatment have been discussed with the patient and family. After consideration of risks, benefits and other options for treatment, the patient has consented to  Procedure(s) with comments: IRRIGATION AND DEBRIDEMENT HAND (Right) - 45 okay per OR, patient last ate at 2:00pm as a surgical intervention.  The patient's history has been reviewed, patient examined, no change in status, stable for surgery.  I have reviewed the patient's chart and labs.  Questions were answered to the patient's satisfaction.     [name] [name]

## 2023-09-03 ENCOUNTER — Encounter (HOSPITAL_COMMUNITY): Payer: Self-pay | Admitting: Orthopedic Surgery

## 2023-09-03 DIAGNOSIS — L089 Local infection of the skin and subcutaneous tissue, unspecified: Secondary | ICD-10-CM | POA: Diagnosis not present

## 2023-09-03 DIAGNOSIS — Z79890 Hormone replacement therapy (postmenopausal): Secondary | ICD-10-CM | POA: Diagnosis not present

## 2023-09-03 DIAGNOSIS — S60511A Abrasion of right hand, initial encounter: Secondary | ICD-10-CM | POA: Diagnosis present

## 2023-09-03 DIAGNOSIS — I10 Essential (primary) hypertension: Secondary | ICD-10-CM | POA: Diagnosis present

## 2023-09-03 DIAGNOSIS — X58XXXA Exposure to other specified factors, initial encounter: Secondary | ICD-10-CM | POA: Diagnosis present

## 2023-09-03 DIAGNOSIS — Z79899 Other long term (current) drug therapy: Secondary | ICD-10-CM | POA: Diagnosis not present

## 2023-09-03 DIAGNOSIS — M24241 Disorder of ligament, right hand: Secondary | ICD-10-CM | POA: Diagnosis present

## 2023-09-03 DIAGNOSIS — L02511 Cutaneous abscess of right hand: Secondary | ICD-10-CM | POA: Diagnosis present

## 2023-09-03 DIAGNOSIS — Y93H1 Activity, digging, shoveling and raking: Secondary | ICD-10-CM | POA: Diagnosis not present

## 2023-09-03 DIAGNOSIS — Y92007 Garden or yard of unspecified non-institutional (private) residence as the place of occurrence of the external cause: Secondary | ICD-10-CM | POA: Diagnosis not present

## 2023-09-03 DIAGNOSIS — S60511D Abrasion of right hand, subsequent encounter: Secondary | ICD-10-CM | POA: Diagnosis not present

## 2023-09-03 DIAGNOSIS — E039 Hypothyroidism, unspecified: Secondary | ICD-10-CM | POA: Diagnosis present

## 2023-09-03 LAB — COMPREHENSIVE METABOLIC PANEL
ALT: 11 U/L (ref 0–44)
AST: 16 U/L (ref 15–41)
Albumin: 3.3 g/dL — ABNORMAL LOW (ref 3.5–5.0)
Alkaline Phosphatase: 55 U/L (ref 38–126)
Anion gap: 9 (ref 5–15)
BUN: 12 mg/dL (ref 8–23)
CO2: 22 mmol/L (ref 22–32)
Calcium: 8.9 mg/dL (ref 8.9–10.3)
Chloride: 106 mmol/L (ref 98–111)
Creatinine, Ser: 1.24 mg/dL — ABNORMAL HIGH (ref 0.44–1.00)
GFR, Estimated: 49 mL/min — ABNORMAL LOW (ref >60)
Glucose, Bld: 198 mg/dL — ABNORMAL HIGH (ref 70–99)
Potassium: 4.6 mmol/L (ref 3.5–5.1)
Sodium: 137 mmol/L (ref 135–145)
Total Bilirubin: 0.2 mg/dL — ABNORMAL LOW (ref 0.3–1.2)
Total Protein: 6.4 g/dL — ABNORMAL LOW (ref 6.5–8.1)

## 2023-09-03 LAB — CBC WITH DIFFERENTIAL/PLATELET
Abs Immature Granulocytes: 0.1 10*3/uL — ABNORMAL HIGH (ref 0.00–0.07)
Basophils Absolute: 0 10*3/uL (ref 0.0–0.1)
Basophils Relative: 0 %
Eosinophils Absolute: 0 10*3/uL (ref 0.0–0.5)
Eosinophils Relative: 0 %
HCT: 31.8 % — ABNORMAL LOW (ref 36.0–46.0)
Hemoglobin: 10.8 g/dL — ABNORMAL LOW (ref 12.0–15.0)
Lymphocytes Relative: 10 %
Lymphs Abs: 0.7 10*3/uL (ref 0.7–4.0)
MCH: 30.9 pg (ref 26.0–34.0)
MCHC: 34 g/dL (ref 30.0–36.0)
MCV: 91.1 fL (ref 80.0–100.0)
Monocytes Absolute: 0 10*3/uL — ABNORMAL LOW (ref 0.1–1.0)
Monocytes Relative: 0 %
Myelocytes: 1 %
Neutro Abs: 6.6 10*3/uL (ref 1.7–7.7)
Neutrophils Relative %: 89 %
Platelets: 210 10*3/uL (ref 150–400)
RBC: 3.49 MIL/uL — ABNORMAL LOW (ref 3.87–5.11)
RDW: 12.2 % (ref 11.5–15.5)
WBC: 7.4 10*3/uL (ref 4.0–10.5)
nRBC: 0 % (ref 0.0–0.2)
nRBC: 0 /100{WBCs}

## 2023-09-03 LAB — HIV ANTIBODY (ROUTINE TESTING W REFLEX): HIV Screen 4th Generation wRfx: NONREACTIVE

## 2023-09-03 MED ORDER — HYDROMORPHONE HCL 1 MG/ML IJ SOLN
0.5000 mg | INTRAMUSCULAR | Status: DC | PRN
  Administered 2023-09-03: 0.5 mg via INTRAVENOUS
  Filled 2023-09-03 (×2): qty 0.5

## 2023-09-03 MED ORDER — LEVOTHYROXINE SODIUM 88 MCG PO TABS: 88.0000 ug | ORAL_TABLET | Freq: Every day | ORAL | Status: DC

## 2023-09-03 MED ORDER — CHLORHEXIDINE GLUCONATE 4 % EX SOLN
Freq: Every day | CUTANEOUS | Status: DC
  Administered 2023-09-03: 2 via TOPICAL
  Administered 2023-09-04: 1 via TOPICAL
  Filled 2023-09-03: qty 15
  Filled 2023-09-03: qty 30

## 2023-09-03 MED ORDER — ACETAMINOPHEN 500 MG PO TABS: 1000.0000 mg | ORAL_TABLET | Freq: Once | ORAL | Status: DC

## 2023-09-03 MED ORDER — VANCOMYCIN HCL IN DEXTROSE 1-5 GM/200ML-% IV SOLN
1000.0000 mg | INTRAVENOUS | Status: DC
  Administered 2023-09-04: 1000 mg via INTRAVENOUS
  Filled 2023-09-03: qty 200

## 2023-09-03 MED ORDER — LEVOTHYROXINE SODIUM 88 MCG PO TABS
88.0000 ug | ORAL_TABLET | Freq: Every day | ORAL | Status: DC
  Administered 2023-09-04: 88 ug via ORAL
  Filled 2023-09-03: qty 1

## 2023-09-03 MED ORDER — VANCOMYCIN HCL 1250 MG/250ML IV SOLN: 1250.0000 mg | INTRAVENOUS | Status: DC

## 2023-09-03 NOTE — Progress Notes (Signed)
Pharmacy Antibiotic Note  Ebony Valdez is a 62 y.o. female admitted on 09/02/2023 with wound infection, now s/p I&D.  Pharmacy has been consulted for vancomycin dosing.  Plan: Vancomycin 1500mg  x1 then 1250mg  IV Q24H. Goal AUC 400-550.  Expected AUC 500.  Height: 5\' 4"  (162.6 cm) Weight: 73.9 kg (162 lb 14.7 oz) IBW/kg (Calculated) : 54.7  Temp (24hrs), Avg:98.6 F (37 C), Min:98.4 F (36.9 C), Max:98.7 F (37.1 C)  Recent Labs  Lab 08/30/23 1926 08/30/23 2219  WBC 6.1  --   CREATININE 1.20*  --   LATICACIDVEN  --  0.3*    Estimated Creatinine Clearance: 47.9 mL/min (A) (by C-G formula based on SCr of 1.2 mg/dL (H)).    No Known Allergies   Thank you for allowing pharmacy to be a part of this patient's care.  Vernard Gambles, PharmD, BCPS  09/03/2023 12:49 AM

## 2023-09-03 NOTE — Anesthesia Postprocedure Evaluation (Signed)
Anesthesia Post Note  Patient: Angelicia Lessner  Procedure(s) Performed: IRRIGATION AND DEBRIDEMENT HAND (Right: Hand)     Patient location during evaluation: PACU Anesthesia Type: General Level of consciousness: sedated, patient cooperative and oriented Pain management: pain level controlled Vital Signs Assessment: post-procedure vital signs reviewed and stable Respiratory status: spontaneous breathing, nonlabored ventilation and respiratory function stable Cardiovascular status: blood pressure returned to baseline and stable Postop Assessment: no apparent nausea or vomiting Anesthetic complications: no   No notable events documented.  Last Vitals:  Vitals:   09/02/23 2345 09/03/23 0000  BP: 124/77 (!) 150/88  Pulse: 74 83  Resp: 12 11  Temp:    SpO2: 99% 99%    Last Pain:  Vitals:   09/03/23 0000  PainSc: 6                  Darryll Raju,E. Calina Patrie

## 2023-09-03 NOTE — Progress Notes (Signed)
   Subjective:  No acute events overnight.  Resting comfortably.  Pain controlled.   Objective:   VITALS:   Vitals:   09/03/23 0052 09/03/23 0547 09/03/23 0808 09/03/23 1507  BP: (!) 140/77 99/61 (!) 143/98 132/75  Pulse: 75 62 76 70  Resp: 16 15 18 18   Temp: 98 F (36.7 C) 98.4 F (36.9 C) 98 F (36.7 C) 99 F (37.2 C)  TempSrc: Oral Oral    SpO2: 94% 96% 96% 99%  Weight:      Height:        Gen: NAD, resting comfortably Pulm: Normal WOB on RA CV: BUE warm and well perfused, normal rate Right hand: Wound is clean and dry, erythema and swelling improved, much better AROM of fingers, SILT m/u/r distribution.  Hand warm and well perfused.     Lab Results  Component Value Date   WBC 7.4 09/03/2023   HGB 10.8 (L) 09/03/2023   HCT 31.8 (L) 09/03/2023   MCV 91.1 09/03/2023   PLT 210 09/03/2023     Assessment/Plan:  62 yo F POD 1 s/p I&D of right hand abscess.  Doing well postop.  Wound looks great.  Erythema and swelling much improved.  - No intra-op culture results yet, recommend continued broad spectrum abx - Will start wound care today - Likely DC tomorrow   [name], MD 09/03/2023, 3:27 PM [phone]

## 2023-09-03 NOTE — Progress Notes (Signed)
PROGRESS NOTE  Ebony Valdez ZOX:[id number] DOB: 07/23/1961 DOA: 09/02/2023 PCP: [name], MD   LOS: 0 days   Brief Narrative / Interim history: Ebony Valdez is a 62 y.o. female with medical history significant of HTN, Hypothyroidism. Pt scraped hand with shovel ~2 weeks ago while outside doing yard work.  6-7 days ago started having edema, erythema, pain to R hand.  Saw PCP on 21st, started on bactrim, failed outpt bactrim.  Saw UC on 24th, concerned for deep wound infection, sent in to ED.  Subjective / 24h Interval events: No complaints. Feels much better.   Assesement and Plan: Principal Problem:   Abrasion of hand with infection   Principal problem Hand infection - s/p I&D in the OR on 9/27. Continue IV antibiotics, monitor cultures. Appreciate ortho follow up  Active problems Hypothyroidism - continue synthroid  Scheduled Meds:  acetaminophen  1,000 mg Oral Once   levothyroxine  88 mcg Oral Daily   Continuous Infusions:  cefTRIAXone (ROCEPHIN)  IV Stopped (09/03/23 0045)   [START ON 09/04/2023] vancomycin     PRN Meds:.acetaminophen **OR** acetaminophen, oxyCODONE-acetaminophen  Current Outpatient Medications  Medication Instructions   Ascorbic Acid (VITAMIN C PO) 1 tablet, Oral, Daily   Calcium Carbonate Antacid (TUMS PO) 2 tablets, Oral, Daily   Cholecalciferol (VITAMIN D-3 PO) 1 tablet, Oral, Daily   Cyanocobalamin (VITAMIN B-12 PO) 1 tablet, Oral, Daily   ECHINACEA PO 1 tablet, Oral, Daily   ibuprofen (ADVIL) 800 mg, Oral, 3 times daily   levothyroxine (SYNTHROID) 88 mcg, Oral, Daily   sulfamethoxazole-trimethoprim (BACTRIM DS) 800-160 MG tablet 1 tablet, Oral, 2 times daily    Diet Orders (From admission, onward)     Start     Ordered   09/02/23 2311  Diet Heart Room service appropriate? Yes; Fluid consistency: Thin  Diet effective now       Question Answer Comment  Room service appropriate? Yes   Fluid consistency: Thin      09/02/23 2311             DVT prophylaxis: SCDs Start: 09/02/23 2307   Lab Results  Component Value Date   PLT 210 09/03/2023      Code Status: Full Code  Family Communication: no family at bedside   Status is: Observation The patient will require care spanning > 2 midnights and should be moved to inpatient because: IV antibiotics   Level of care: Med-Surg  Consultants:  Orthopedic surgery   Objective: Vitals:   09/03/23 0015 09/03/23 0052 09/03/23 0547 09/03/23 0808  BP: (!) 155/92 (!) 140/77 99/61 (!) 143/98  Pulse: 88 75 62 76  Resp: 16 16 15 18   Temp: 98.7 F (37.1 C) 98 F (36.7 C) 98.4 F (36.9 C) 98 F (36.7 C)  TempSrc:  Oral Oral   SpO2: 98% 94% 96% 96%  Weight:      Height:        Intake/Output Summary (Last 24 hours) at 09/03/2023 1303 Last data filed at 09/03/2023 0900 Gross per 24 hour  Intake 520 ml  Output 10 ml  Net 510 ml   Wt Readings from Last 3 Encounters:  09/02/23 73.9 kg  07/22/23 73.9 kg  10/06/15 71.2 kg    Examination:  Constitutional: NAD Eyes: no scleral icterus ENMT: Mucous membranes are moist.  Neck: normal, supple Respiratory: clear to auscultation bilaterally, no wheezing, no crackles. Normal respiratory effort. No accessory muscle use.  Cardiovascular: Regular rate and rhythm, no murmurs / rubs / gallops.  No LE edema.  Abdomen: non distended, no tenderness. Bowel sounds positive.  Musculoskeletal: no clubbing / cyanosis.  Skin: no rashes Neurologic: non focal   Data Reviewed: I have independently reviewed following labs and imaging studies   CBC Recent Labs  Lab 08/30/23 1926 09/03/23 0414  WBC 6.1 7.4  HGB 11.5* 10.8*  HCT 34.8* 31.8*  PLT 179 210  MCV 91.8 91.1  MCH 30.3 30.9  MCHC 33.0 34.0  RDW 12.6 12.2  LYMPHSABS 1.4 0.7  MONOABS 0.5 0.0*  EOSABS 0.2 0.0  BASOSABS 0.0 0.0    Recent Labs  Lab 08/30/23 1926 08/30/23 2219 09/03/23 0414  NA 138  --  137  K 3.7  --  4.6  CL 107  --  106  CO2 24  --  22   GLUCOSE 116*  --  198*  BUN 15  --  12  CREATININE 1.20*  --  1.24*  CALCIUM 9.2  --  8.9  AST 15  --  16  ALT 14  --  11  ALKPHOS 54  --  55  BILITOT 0.4  --  0.2*  ALBUMIN 4.0  --  3.3*  LATICACIDVEN  --  0.3*  --     ------------------------------------------------------------------------------------------------------------------ No results for input(s): "CHOL", "HDL", "LDLCALC", "TRIG", "CHOLHDL", "LDLDIRECT" in the last 72 hours.  No results found for: "HGBA1C" ------------------------------------------------------------------------------------------------------------------ No results for input(s): "TSH", "T4TOTAL", "T3FREE", "THYROIDAB" in the last 72 hours.  Invalid input(s): "FREET3"  Cardiac Enzymes No results for input(s): "CKMB", "TROPONINI", "MYOGLOBIN" in the last 168 hours.  Invalid input(s): "CK" ------------------------------------------------------------------------------------------------------------------ No results found for: "BNP"  CBG: No results for input(s): "GLUCAP" in the last 168 hours.  Recent Results (from the past 240 hour(s))  Culture, blood (Routine X 2) w Reflex to ID Panel     Status: None (Preliminary result)   Collection Time: 09/03/23  4:14 AM   Specimen: BLOOD  Result Value Ref Range Status   Specimen Description BLOOD BLOOD RIGHT ARM  Final   Special Requests   Final    BOTTLES DRAWN AEROBIC AND ANAEROBIC Blood Culture adequate volume   Culture   Final    NO GROWTH < 12 HOURS Performed at [facility], [address], [place], [place] [address]    Report Status PENDING  Incomplete  Culture, blood (Routine X 2) w Reflex to ID Panel     Status: None (Preliminary result)   Collection Time: 09/03/23  4:14 AM   Specimen: BLOOD  Result Value Ref Range Status   Specimen Description BLOOD BLOOD RIGHT ARM  Final   Special Requests   Final    BOTTLES DRAWN AEROBIC AND ANAEROBIC Blood Culture adequate volume   Culture   Final     NO GROWTH < 12 HOURS Performed at [facility], [address] [place], [place] [address]    Report Status PENDING  Incomplete     Radiology Studies: No results found.   Pamella Pert, MD, PhD [facility]  Between 7 am - 7 pm I am available, please contact me via Amion (for emergencies) or Securechat (non urgent messages)  Between 7 pm - 7 am I am not available, please contact night coverage MD/APP via Amion

## 2023-09-03 NOTE — ED Provider Notes (Signed)
[place] EMERGENCY DEPARTMENT AT [facility] Provider Note   CSN: [id number] Arrival date & time: 08/30/23  4132 This is a delayed entry for services provided on the above date and time   History Chief Complaint  Patient presents with   Hand Problem    HPI Ebony Valdez is a 62 y.o. female presenting for chief complaint of right hand pain and swelling.  States that she had multiple blisters over the palmar aspect of her right hand after shoveling yesterday and has now started getting some swelling and pain over the volar aspect of her hand.  Comes in for further care and management.  Denies any fevers chills nausea vomiting syncope shortness of breath.  Patient's recorded medical, surgical, social, medication list and allergies were reviewed in the Snapshot window as part of the initial history.   Review of Systems   Review of Systems  Constitutional:  Negative for chills and fever.  HENT:  Negative for ear pain and sore throat.   Eyes:  Negative for pain and visual disturbance.  Respiratory:  Negative for cough and shortness of breath.   Cardiovascular:  Negative for chest pain and palpitations.  Gastrointestinal:  Negative for abdominal pain and vomiting.  Genitourinary:  Negative for dysuria and hematuria.  Musculoskeletal:  Negative for arthralgias and back pain.  Skin:  Positive for wound. Negative for color change and rash.  Neurological:  Negative for seizures and syncope.  All other systems reviewed and are negative.   Physical Exam Updated Vital Signs BP (!) 147/89 (BP Location: Left Arm)   Pulse 71   Temp 98.2 F (36.8 C) (Oral)   Resp 16   SpO2 96%  Physical Exam Vitals and nursing note reviewed.  Constitutional:      General: She is not in acute distress.    Appearance: She is well-developed.  HENT:     Head: Normocephalic and atraumatic.  Eyes:     Conjunctiva/sclera: Conjunctivae normal.  Cardiovascular:     Rate and Rhythm: Normal rate  and regular rhythm.     Heart sounds: No murmur heard. Pulmonary:     Effort: Pulmonary effort is normal. No respiratory distress.     Breath sounds: Normal breath sounds.  Abdominal:     Palpations: Abdomen is soft.     Tenderness: There is no abdominal tenderness.  Musculoskeletal:        General: Signs of injury present. No swelling.     Cervical back: Neck supple.     Comments: Patient has soft tissue swelling over the palmar aspect of her third metacarpophalangeal joint. Already appears to be draining purulent fluid at this time. Volar aspect of the hand with small amount of erythema. Range of motion intact during my exam including PIP and DIPs at this time.  Skin:    General: Skin is warm and dry.     Capillary Refill: Capillary refill takes less than 2 seconds.  Neurological:     Mental Status: She is alert.  Psychiatric:        Mood and Affect: Mood normal.      ED Course/ Medical Decision Making/ A&P    Procedures Ultrasound ED Soft Tissue  Date/Time: 09/03/2023 12:24 AM  Performed by: Glyn Ade, MD Authorized by: Glyn Ade, MD   Procedure details:    Indications: limb pain, localization of abscess, evaluate for cellulitis and evaluate for foreign body     Transverse view:  Visualized   Longitudinal view:  Visualized  Location:    Location: upper extremity     Side:  Right Findings:     abscess present    cellulitis present    no foreign body present Comments:     Approximately 0.5cc of fluid material identified on ultrasound of the hand. Marland Kitchen.Incision and Drainage  Date/Time: 09/03/2023 12:25 AM  Performed by: Glyn Ade, MD Authorized by: Glyn Ade, MD   Consent:    Consent obtained:  Verbal   Consent given by:  Patient   Risks, benefits, and alternatives were discussed: yes     Risks discussed:  Bleeding, damage to other organs, infection, incomplete drainage and pain   Alternatives discussed:  Delayed treatment,  alternative treatment, observation, referral and no treatment Universal protocol:    Patient identity confirmed:  Verbally with patient Location:    Type:  Abscess Pre-procedure details:    Skin preparation:  Chlorhexidine Anesthesia:    Anesthesia method:  Local infiltration   Local anesthetic:  Lidocaine 1% w/o epi Procedure type:    Complexity:  Simple Procedure details:    Ultrasound guidance: yes     Needle aspiration: yes     Needle size:  18 G   Incision types:  Single with marsupialization   Wound management:  Irrigated with saline   Drainage:  Purulent and bloody   Drainage amount:  Scant   Wound treatment:  Wound left open   Packing materials:  None Post-procedure details:    Procedure completion:  Tolerated    Medications Ordered in ED Medications  lidocaine (PF) (XYLOCAINE) 1 % injection 30 mL (30 mLs Intradermal Given 08/30/23 2326)  fentaNYL (SUBLIMAZE) injection 50 mcg (50 mcg Intramuscular Given 08/30/23 2326)  cefTRIAXone (ROCEPHIN) injection 500 mg (500 mg Intramuscular Given 08/31/23 0030)    Medical Decision Making:   This is a 62 year old female presenting with a abscess of the palmar surface of the right hand after a blister popped while shoveling. The abscess was incised and drained as above with only a small amount of fluid extracted in the context of only a small amount of fluid visible on ultrasound.  She has cellulitis on the dorsum of the hand as well on exam and ultrasound.  She is only taken 2 doses of the Bactrim.  Will escalate her care with IM Rocephin and recommend she continue with the Bactrim for broad-spectrum antibiosis. Very strict return precautions regarding her condition.  Her procedure was terminated early at her request due to pain despite nearly 25 cc of lidocaine administration as well as IM fentanyl.  Currently her condition is not consistent with flexor tendon tenosynovitis nor necrotizing infection given the nature of the presentation  this evening.  I have told her to follow-up at the Ortho care walk-in clinic tomorrow and have attached resources for her to obtain follow-up.  Patient is in agreement with this plan at this time.  Disposition:  I have considered need for hospitalization, however, considering all of the above, I believe this patient is stable for discharge at this time.  Patient/family educated about specific return precautions for given chief complaint and symptoms.  Patient/family educated about follow-up with PCP and Orthopedics.     Patient/family expressed understanding of return precautions and need for follow-up. Patient spoken to regarding all imaging and laboratory results and appropriate follow up for these results. All education provided in verbal form with additional information in written form. Time was allowed for answering of patient questions. Patient discharged.    Emergency Department  Medication Summary:   Medications  lidocaine (PF) (XYLOCAINE) 1 % injection 30 mL (30 mLs Intradermal Given 08/30/23 2326)  fentaNYL (SUBLIMAZE) injection 50 mcg (50 mcg Intramuscular Given 08/30/23 2326)  cefTRIAXone (ROCEPHIN) injection 500 mg (500 mg Intramuscular Given 08/31/23 0030)        Clinical Impression:  1. Cellulitis of right hand      Discharge   Final Clinical Impression(s) / ED Diagnoses Final diagnoses:  Cellulitis of right hand    Rx / DC Orders ED Discharge Orders          Ordered    ibuprofen (ADVIL) 800 MG tablet  3 times daily        08/31/23 0010              Glyn Ade, MD 09/03/23 0031

## 2023-09-04 DIAGNOSIS — S60511D Abrasion of right hand, subsequent encounter: Secondary | ICD-10-CM

## 2023-09-04 DIAGNOSIS — L089 Local infection of the skin and subcutaneous tissue, unspecified: Secondary | ICD-10-CM | POA: Diagnosis not present

## 2023-09-04 MED ORDER — DOXYCYCLINE HYCLATE 100 MG PO CAPS: 100.0000 mg | ORAL_CAPSULE | Freq: Two times a day (BID) | ORAL | 0 refills | Status: AC

## 2023-09-04 MED ORDER — OXYCODONE-ACETAMINOPHEN 5-325 MG PO TABS: 1.0000 | ORAL_TABLET | Freq: Three times a day (TID) | ORAL | 0 refills | Status: AC | PRN

## 2023-09-04 MED ORDER — AMOXICILLIN-POT CLAVULANATE 875-125 MG PO TABS: 1.0000 | ORAL_TABLET | Freq: Two times a day (BID) | ORAL | 0 refills | Status: AC

## 2023-09-04 NOTE — Discharge Summary (Signed)
Physician Discharge Summary  Ebony Valdez IHK:[id number] DOB: 23-Oct-1961 DOA: 09/02/2023  PCP: [name], MD  Admit date: 09/02/2023 Discharge date: 09/04/2023  Admitted From: home Disposition:  home  Recommendations for Outpatient Follow-up:  Follow up with PCP in 1-2 weeks Follow-up with Dr. [name] next week in his office  Home Health: none Equipment/Devices: none  Discharge Condition: stable CODE STATUS: Full code  HPI: Per admitting MD, Ebony Valdez is a 62 y.o. female with medical history significant of HTN, Hypothyroidism. Pt scraped hand with shovel ~2 weeks ago while outside doing yard work.  6-7 days ago started having edema, erythema, pain to R hand.  Saw PCP on 21st, started on bactrim, failed outpt bactrim.  Saw UC on 24th, concerned for deep wound infection, sent in to ED. Not quite sure what happened in ED as other than her triage note, no provider note is visible from 9/24.  Do suspect she was seen however, as looks like she was given rocephin, x1, fentanyl, lidocaine (I think I+D might have been performed in ED?)  Dont see that any outpt ABx prescribed. Regardless pt seen in ortho office today by Dr. Frazier Butt.  Taken to [facility] OR for formal I&D.  I am asked to admit pt for ABx post op.  Hospital Course / Discharge diagnoses: Principal Problem:   Abrasion of hand with infection Active Problems:   Blister of hand, infected   Principal problem Hand infection -orthopedic surgery was consulted and followed patient while hospitalized.  She was taken to the OR on 9/27 status post I&D.  Wound was evaluated again by orthopedic surgery, looks good, discussed with Dr. [name], there have been some problems as micro lab has not received the intraoperative specimen, however currently she is stable to go home on empiric antibiotics with MRSA coverage.  He will see her in office next week.  She is afebrile and pain is controlled on Percocet.  Will provide a short course of  oral pain medications  Active problems Hypothyroidism - continue synthroid  Sepsis ruled out   Discharge Instructions   Allergies as of 09/04/2023   No Known Allergies      Medication List     STOP taking these medications    sulfamethoxazole-trimethoprim 800-160 MG tablet Commonly known as: BACTRIM DS       TAKE these medications    amoxicillin-clavulanate 875-125 MG tablet Commonly known as: AUGMENTIN Take 1 tablet by mouth 2 (two) times daily for 9 days.   doxycycline 100 MG capsule Commonly known as: VIBRAMYCIN Take 1 capsule (100 mg total) by mouth 2 (two) times daily for 9 days.   ECHINACEA PO Take 1 tablet by mouth daily.   ibuprofen 800 MG tablet Commonly known as: ADVIL Take 1 tablet (800 mg total) by mouth 3 (three) times daily.   levothyroxine 88 MCG tablet Commonly known as: SYNTHROID Take 88 mcg by mouth daily.   oxyCODONE-acetaminophen 5-325 MG tablet Commonly known as: PERCOCET/ROXICET Take 1-2 tablets by mouth every 8 (eight) hours as needed for severe pain.   TUMS PO Take 2 tablets by mouth daily.   VITAMIN B-12 PO Take 1 tablet by mouth daily.   VITAMIN C PO Take 1 tablet by mouth daily.   VITAMIN D-3 PO Take 1 tablet by mouth daily.        Follow-up Information     [name], MD Follow up in 3 day(s).   Specialty: Orthopedic Surgery Contact information: [address] [place]  [place] [address] [phone]                 Consultations: Orthopedic surgery   Procedures/Studies:  DG Hand Complete Right  Result Date: 08/30/2023 CLINICAL DATA:  Acute right hand pain and swelling. EXAM: RIGHT HAND - COMPLETE 3+ VIEW COMPARISON:  None Available. FINDINGS: There is no evidence of fracture or dislocation. There is no evidence of arthropathy or other focal bone abnormality. Soft tissues are unremarkable. IMPRESSION: Negative. Electronically Signed   By: [name] M.D.   On: 08/30/2023 20:14      Subjective: - no chest pain, shortness of breath, no abdominal pain, nausea or vomiting.   Discharge Exam: BP 125/73 (BP Location: Left Arm)   Pulse 69   Temp 98.3 F (36.8 C) (Oral)   Resp 18   Ht 5\' 4"  (1.626 m)   Wt 73.9 kg   SpO2 94%   BMI 27.97 kg/m   General: Pt is alert, awake, not in acute distress Cardiovascular: RRR, S1/S2 +, no rubs, no gallops Respiratory: CTA bilaterally, no wheezing, no rhonchi Abdominal: Soft, NT, ND, bowel sounds + Extremities: no edema, no cyanosis   The results of significant diagnostics from this hospitalization (including imaging, microbiology, ancillary and laboratory) are listed below for reference.     Microbiology: Recent Results (from the past 240 hour(s))  Culture, blood (Routine X 2) w Reflex to ID Panel     Status: None (Preliminary result)   Collection Time: 09/03/23  4:14 AM   Specimen: BLOOD  Result Value Ref Range Status   Specimen Description BLOOD BLOOD RIGHT ARM  Final   Special Requests   Final    BOTTLES DRAWN AEROBIC AND ANAEROBIC Blood Culture adequate volume   Culture   Final    NO GROWTH < 12 HOURS Performed at [facility], [address], [place], [place] [address]    Report Status PENDING  Incomplete  Culture, blood (Routine X 2) w Reflex to ID Panel     Status: None (Preliminary result)   Collection Time: 09/03/23  4:14 AM   Specimen: BLOOD  Result Value Ref Range Status   Specimen Description BLOOD BLOOD RIGHT ARM  Final   Special Requests   Final    BOTTLES DRAWN AEROBIC AND ANAEROBIC Blood Culture adequate volume   Culture   Final    NO GROWTH < 12 HOURS Performed at [facility], [address], [place], [place] [address]    Report Status PENDING  Incomplete     Labs: Basic Metabolic Panel: Recent Labs  Lab 08/30/23 1926 09/03/23 0414  NA 138 137  K 3.7 4.6  CL 107 106  CO2 24 22  GLUCOSE 116* 198*  BUN 15 12  CREATININE 1.20* 1.24*  CALCIUM 9.2 8.9   Liver Function  Tests: Recent Labs  Lab 08/30/23 1926 09/03/23 0414  AST 15 16  ALT 14 11  ALKPHOS 54 55  BILITOT 0.4 0.2*  PROT 7.0 6.4*  ALBUMIN 4.0 3.3*   CBC: Recent Labs  Lab 08/30/23 1926 09/03/23 0414  WBC 6.1 7.4  NEUTROABS 4.0 6.6  HGB 11.5* 10.8*  HCT 34.8* 31.8*  MCV 91.8 91.1  PLT 179 210   CBG: No results for input(s): "GLUCAP" in the last 168 hours. Hgb A1c No results for input(s): "HGBA1C" in the last 72 hours. Lipid Profile No results for input(s): "CHOL", "HDL", "LDLCALC", "TRIG", "CHOLHDL", "LDLDIRECT" in the last 72 hours. Thyroid function studies  No results for input(s): "TSH", "T4TOTAL", "T3FREE", "THYROIDAB" in the last 72 hours.  Invalid input(s): "FREET3" Urinalysis    Component Value Date/Time   COLORURINE YELLOW 07/21/2023 2109   APPEARANCEUR HAZY (A) 07/21/2023 2109   LABSPEC 1.023 07/21/2023 2109   PHURINE 5.0 07/21/2023 2109   GLUCOSEU NEGATIVE 07/21/2023 2109   HGBUR NEGATIVE 07/21/2023 2109   BILIRUBINUR NEGATIVE 07/21/2023 2109   KETONESUR 5 (A) 07/21/2023 2109   PROTEINUR NEGATIVE 07/21/2023 2109   NITRITE NEGATIVE 07/21/2023 2109   LEUKOCYTESUR MODERATE (A) 07/21/2023 2109    FURTHER DISCHARGE INSTRUCTIONS:   Get Medicines reviewed and adjusted: Please take all your medications with you for your next visit with your Primary MD   Laboratory/radiological data: Please request your Primary MD to go over all hospital tests and procedure/radiological results at the follow up, please ask your Primary MD to get all Hospital records sent to his/her office.   In some cases, they will be blood work, cultures and biopsy results pending at the time of your discharge. Please request that your primary care M.D. goes through all the records of your hospital data and follows up on these results.   Also Note the following: If you experience worsening of your admission symptoms, develop shortness of breath, life threatening emergency, suicidal or homicidal  thoughts you must seek medical attention immediately by calling 911 or calling your MD immediately  if symptoms less severe.   You must read complete instructions/literature along with all the possible adverse reactions/side effects for all the Medicines you take and that have been prescribed to you. Take any new Medicines after you have completely understood and accpet all the possible adverse reactions/side effects.    Do not drive when taking Pain medications or sleeping medications (Benzodaizepines)   Do not take more than prescribed Pain, Sleep and Anxiety Medications. It is not advisable to combine anxiety,sleep and pain medications without talking with your primary care practitioner   Special Instructions: If you have smoked or chewed Tobacco  in the last 2 yrs please stop smoking, stop any regular Alcohol  and or any Recreational drug use.   Wear Seat belts while driving.   Please note: You were cared for by a hospitalist during your hospital stay. Once you are discharged, your primary care physician will handle any further medical issues. Please note that NO REFILLS for any discharge medications will be authorized once you are discharged, as it is imperative that you return to your primary care physician (or establish a relationship with a primary care physician if you do not have one) for your post hospital discharge needs so that they can reassess your need for medications and monitor your lab values.  Time coordinating discharge: 35 minutes  SIGNED:  Pamella Pert, MD, PhD 09/04/2023, 9:14 AM

## 2023-09-04 NOTE — Discharge Instructions (Addendum)
Warm soap and water daily or twice daily and put on a clean, dry, bandage.   Follow up with Dr Lawana Pai next week  Follow with [name], MD in 1-2 weeks  Please get a complete blood count and chemistry panel checked by your Primary MD at your next visit, and again as instructed by your Primary MD. Please get your medications reviewed and adjusted by your Primary MD.  Please request your Primary MD to go over all Hospital Tests and Procedure/Radiological results at the follow up, please get all Hospital records sent to your Prim MD by signing hospital release before you go home.  In some cases, there will be blood work, cultures and biopsy results pending at the time of your discharge. Please request that your primary care M.D. goes through all the records of your hospital data and follows up on these results.  If you had Pneumonia of Lung problems at the Hospital: Please get a 2 view Chest X ray done in 6-8 weeks after hospital discharge or sooner if instructed by your Primary MD.  If you have Congestive Heart Failure: Please call your Cardiologist or Primary MD anytime you have any of the following symptoms:  1) 3 pound weight gain in 24 hours or 5 pounds in 1 week  2) shortness of breath, with or without a dry hacking cough  3) swelling in the hands, feet or stomach  4) if you have to sleep on extra pillows at night in order to breathe  Follow cardiac low salt diet and 1.5 lit/day fluid restriction.  If you have diabetes Accuchecks 4 times/day, Once in AM empty stomach and then before each meal. Log in all results and show them to your primary doctor at your next visit. If any glucose reading is under 80 or above 300 call your primary MD immediately.  If you have Seizure/Convulsions/Epilepsy: Please do not drive, operate heavy machinery, participate in activities at heights or participate in high speed sports until you have seen by Primary MD or a Neurologist and advised to do  so again. Per [facility] statutes, patients with seizures are not allowed to drive until they have been seizure-free for six months.  Use caution when using heavy equipment or power tools. Avoid working on ladders or at heights. Take showers instead of baths. Ensure the water temperature is not too high on the home water heater. Do not go swimming alone. Do not lock yourself in a room alone (i.e. bathroom). When caring for infants or small children, sit down when holding, feeding, or changing them to minimize risk of injury to the child in the event you have a seizure. Maintain good sleep hygiene. Avoid alcohol.   If you had Gastrointestinal Bleeding: Please ask your Primary MD to check a complete blood count within one week of discharge or at your next visit. Your endoscopic/colonoscopic biopsies that are pending at the time of discharge, will also need to followed by your Primary MD.  Get Medicines reviewed and adjusted. Please take all your medications with you for your next visit with your Primary MD  Please request your Primary MD to go over all hospital tests and procedure/radiological results at the follow up, please ask your Primary MD to get all Hospital records sent to his/her office.  If you experience worsening of your admission symptoms, develop shortness of breath, life threatening emergency, suicidal or homicidal thoughts you must seek medical attention immediately by calling 911 or calling your MD immediately  if symptoms less severe.  You must read complete instructions/literature along with all the possible adverse reactions/side effects for all the Medicines you take and that have been prescribed to you. Take any new Medicines after you have completely understood and accpet all the possible adverse reactions/side effects.   Do not drive or operate heavy machinery when taking Pain medications.   Do not take more than prescribed Pain, Sleep and Anxiety  Medications  Special Instructions: If you have smoked or chewed Tobacco  in the last 2 yrs please stop smoking, stop any regular Alcohol  and or any Recreational drug use.  Wear Seat belts while driving.  Please note You were cared for by a hospitalist during your hospital stay. If you have any questions about your discharge medications or the care you received while you were in the hospital after you are discharged, you can call the unit and asked to speak with the hospitalist on call if the hospitalist that took care of you is not available. Once you are discharged, your primary care physician will handle any further medical issues. Please note that NO REFILLS for any discharge medications will be authorized once you are discharged, as it is imperative that you return to your primary care physician (or establish a relationship with a primary care physician if you do not have one) for your aftercare needs so that they can reassess your need for medications and monitor your lab values.  You can reach the hospitalist office at phone [phone] or fax [phone]   If you do not have a primary care physician, you can call [phone] for a physician referral.  Activity: As tolerated with Full fall precautions use walker/cane & assistance as needed    Diet: regular  Disposition Home

## 2023-09-08 LAB — CULTURE, BLOOD (ROUTINE X 2)
Culture: NO GROWTH
Culture: NO GROWTH
Special Requests: ADEQUATE
Special Requests: ADEQUATE

## 2023-09-30 ENCOUNTER — Ambulatory Visit: Payer: Managed Care, Other (non HMO) | Admitting: Orthopedic Surgery
